# Patient Record
Sex: Female | Born: 1965 | Race: White | Hispanic: No | Marital: Single | Smoking: Current every day smoker
Health system: Southern US, Community
[De-identification: ages and names within clinical notes are randomized; demographics above are authoritative.]

## PROBLEM LIST (undated history)

## (undated) ENCOUNTER — Emergency Department (HOSPITAL_COMMUNITY): Admission: EM | Payer: Medicare Other | Source: Home / Self Care

## (undated) DIAGNOSIS — F410 Panic disorder [episodic paroxysmal anxiety] without agoraphobia: Secondary | ICD-10-CM

## (undated) DIAGNOSIS — F329 Major depressive disorder, single episode, unspecified: Secondary | ICD-10-CM

## (undated) DIAGNOSIS — F1020 Alcohol dependence, uncomplicated: Secondary | ICD-10-CM

## (undated) DIAGNOSIS — T1491XA Suicide attempt, initial encounter: Secondary | ICD-10-CM

## (undated) DIAGNOSIS — F319 Bipolar disorder, unspecified: Secondary | ICD-10-CM

## (undated) DIAGNOSIS — E7800 Pure hypercholesterolemia, unspecified: Secondary | ICD-10-CM

## (undated) DIAGNOSIS — F419 Anxiety disorder, unspecified: Secondary | ICD-10-CM

## (undated) DIAGNOSIS — R002 Palpitations: Secondary | ICD-10-CM

## (undated) DIAGNOSIS — F1120 Opioid dependence, uncomplicated: Secondary | ICD-10-CM

## (undated) DIAGNOSIS — B86 Scabies: Secondary | ICD-10-CM

## (undated) DIAGNOSIS — T63301A Toxic effect of unspecified spider venom, accidental (unintentional), initial encounter: Secondary | ICD-10-CM

## (undated) DIAGNOSIS — J40 Bronchitis, not specified as acute or chronic: Secondary | ICD-10-CM

## (undated) DIAGNOSIS — I10 Essential (primary) hypertension: Secondary | ICD-10-CM

## (undated) DIAGNOSIS — J449 Chronic obstructive pulmonary disease, unspecified: Secondary | ICD-10-CM

## (undated) DIAGNOSIS — F32A Depression, unspecified: Secondary | ICD-10-CM

## (undated) DIAGNOSIS — Z87442 Personal history of urinary calculi: Secondary | ICD-10-CM

## (undated) HISTORY — DX: Bronchitis, not specified as acute or chronic: J40

## (undated) HISTORY — PX: CERVICAL CERCLAGE: SHX1329

## (undated) HISTORY — DX: Opioid dependence, uncomplicated: F11.20

## (undated) HISTORY — DX: Toxic effect of unspecified spider venom, accidental (unintentional), initial encounter: T63.301A

## (undated) HISTORY — DX: Alcohol dependence, uncomplicated: F10.20

## (undated) HISTORY — DX: Bipolar disorder, unspecified: F31.9

## (undated) HISTORY — DX: Suicide attempt, initial encounter: T14.91XA

---

## 1998-05-10 DIAGNOSIS — F319 Bipolar disorder, unspecified: Secondary | ICD-10-CM

## 1998-05-10 HISTORY — DX: Bipolar disorder, unspecified: F31.9

## 2006-05-10 DIAGNOSIS — T63301A Toxic effect of unspecified spider venom, accidental (unintentional), initial encounter: Secondary | ICD-10-CM

## 2006-05-10 HISTORY — DX: Toxic effect of unspecified spider venom, accidental (unintentional), initial encounter: T63.301A

## 2006-10-26 ENCOUNTER — Emergency Department (HOSPITAL_COMMUNITY): Admission: EM | Admit: 2006-10-26 | Discharge: 2006-10-27 | Payer: Self-pay | Admitting: Emergency Medicine

## 2007-06-21 ENCOUNTER — Emergency Department (HOSPITAL_COMMUNITY): Admission: EM | Admit: 2007-06-21 | Discharge: 2007-06-21 | Payer: Self-pay | Admitting: Emergency Medicine

## 2008-03-16 ENCOUNTER — Emergency Department (HOSPITAL_COMMUNITY): Admission: EM | Admit: 2008-03-16 | Discharge: 2008-03-16 | Payer: Self-pay | Admitting: Emergency Medicine

## 2008-05-10 HISTORY — PX: OTHER SURGICAL HISTORY: SHX169

## 2008-08-01 ENCOUNTER — Emergency Department (HOSPITAL_COMMUNITY): Admission: EM | Admit: 2008-08-01 | Discharge: 2008-08-02 | Payer: Self-pay | Admitting: Emergency Medicine

## 2009-02-18 ENCOUNTER — Ambulatory Visit: Payer: Self-pay | Admitting: Family Medicine

## 2009-02-18 DIAGNOSIS — R5381 Other malaise: Secondary | ICD-10-CM | POA: Insufficient documentation

## 2009-02-18 DIAGNOSIS — F17200 Nicotine dependence, unspecified, uncomplicated: Secondary | ICD-10-CM | POA: Insufficient documentation

## 2009-02-18 DIAGNOSIS — F329 Major depressive disorder, single episode, unspecified: Secondary | ICD-10-CM | POA: Insufficient documentation

## 2009-02-18 DIAGNOSIS — N920 Excessive and frequent menstruation with regular cycle: Secondary | ICD-10-CM | POA: Insufficient documentation

## 2009-02-18 DIAGNOSIS — J0100 Acute maxillary sinusitis, unspecified: Secondary | ICD-10-CM | POA: Insufficient documentation

## 2009-02-18 DIAGNOSIS — F319 Bipolar disorder, unspecified: Secondary | ICD-10-CM | POA: Insufficient documentation

## 2009-02-18 DIAGNOSIS — R5383 Other fatigue: Secondary | ICD-10-CM

## 2009-02-18 LAB — CONVERTED CEMR LAB
ALT: 10 units/L (ref 0–35)
AST: 13 units/L (ref 0–37)
Albumin: 4.5 g/dL (ref 3.5–5.2)
Alkaline Phosphatase: 52 units/L (ref 39–117)
BUN: 10 mg/dL (ref 6–23)
Basophils Absolute: 0 10*3/uL (ref 0.0–0.1)
Basophils Relative: 0 % (ref 0–1)
Bilirubin, Direct: 0.1 mg/dL (ref 0.0–0.3)
CO2: 20 meq/L (ref 19–32)
Calcium: 8.7 mg/dL (ref 8.4–10.5)
Chloride: 105 meq/L (ref 96–112)
Cholesterol: 156 mg/dL (ref 0–200)
Creatinine, Ser: 0.72 mg/dL (ref 0.40–1.20)
Eosinophils Absolute: 0.2 10*3/uL (ref 0.0–0.7)
Eosinophils Relative: 2 % (ref 0–5)
Glucose, Bld: 90 mg/dL (ref 70–99)
HCT: 37.3 % (ref 36.0–46.0)
HDL: 43 mg/dL (ref >39)
Hemoglobin: 12.4 g/dL (ref 12.0–15.0)
Indirect Bilirubin: 0.2 mg/dL (ref 0.0–0.9)
LDL Cholesterol: 92 mg/dL (ref 0–99)
Lymphocytes Relative: 26 % (ref 12–46)
Lymphs Abs: 2.7 10*3/uL (ref 0.7–4.0)
MCHC: 33.2 g/dL (ref 30.0–36.0)
MCV: 96.1 fL (ref 78.0–100.0)
Monocytes Absolute: 0.6 10*3/uL (ref 0.1–1.0)
Monocytes Relative: 5 % (ref 3–12)
Neutro Abs: 6.9 10*3/uL (ref 1.7–7.7)
Neutrophils Relative %: 67 % (ref 43–77)
Platelets: 222 10*3/uL (ref 150–400)
Potassium: 3.9 meq/L (ref 3.5–5.3)
RBC: 3.88 M/uL (ref 3.87–5.11)
RDW: 14.6 % (ref 11.5–15.5)
Sodium: 139 meq/L (ref 135–145)
TSH: 0.565 microintl units/mL (ref 0.350–4.500)
Total Bilirubin: 0.3 mg/dL (ref 0.3–1.2)
Total CHOL/HDL Ratio: 3.6
Total Protein: 6.8 g/dL (ref 6.0–8.3)
Triglycerides: 104 mg/dL (ref <150)
VLDL: 21 mg/dL (ref 0–40)
WBC: 10.4 10*3/uL (ref 4.0–10.5)

## 2009-02-28 ENCOUNTER — Ambulatory Visit (HOSPITAL_COMMUNITY): Admission: RE | Admit: 2009-02-28 | Discharge: 2009-02-28 | Payer: Self-pay | Admitting: Family Medicine

## 2009-04-01 ENCOUNTER — Ambulatory Visit: Payer: Self-pay | Admitting: Family Medicine

## 2009-04-01 DIAGNOSIS — N3000 Acute cystitis without hematuria: Secondary | ICD-10-CM | POA: Insufficient documentation

## 2009-04-01 DIAGNOSIS — N739 Female pelvic inflammatory disease, unspecified: Secondary | ICD-10-CM | POA: Insufficient documentation

## 2009-04-01 LAB — CONVERTED CEMR LAB
Basophils Absolute: 0.1 10*3/uL (ref 0.0–0.1)
Basophils Relative: 0 % (ref 0–1)
Bilirubin Urine: NEGATIVE
Blood in Urine, dipstick: NEGATIVE
Eosinophils Absolute: 0.2 10*3/uL (ref 0.0–0.7)
Eosinophils Relative: 2 % (ref 0–5)
Glucose, Urine, Semiquant: NEGATIVE
HCT: 37.9 % (ref 36.0–46.0)
Hemoglobin: 12.7 g/dL (ref 12.0–15.0)
Ketones, urine, test strip: NEGATIVE
Lymphocytes Relative: 27 % (ref 12–46)
Lymphs Abs: 3.3 10*3/uL (ref 0.7–4.0)
MCHC: 33.5 g/dL (ref 30.0–36.0)
MCV: 93.8 fL (ref 78.0–100.0)
Monocytes Absolute: 0.7 10*3/uL (ref 0.1–1.0)
Monocytes Relative: 6 % (ref 3–12)
Neutro Abs: 7.9 10*3/uL — ABNORMAL HIGH (ref 1.7–7.7)
Neutrophils Relative %: 65 % (ref 43–77)
Nitrite: NEGATIVE
Platelets: 262 10*3/uL (ref 150–400)
Protein, U semiquant: NEGATIVE
RBC: 4.04 M/uL (ref 3.87–5.11)
RDW: 14.3 % (ref 11.5–15.5)
Specific Gravity, Urine: 1.01
Urobilinogen, UA: 0.2
WBC Urine, dipstick: NEGATIVE
WBC: 12.1 10*3/uL — ABNORMAL HIGH (ref 4.0–10.5)
pH: 7

## 2009-04-09 ENCOUNTER — Encounter: Payer: Self-pay | Admitting: Family Medicine

## 2009-05-05 ENCOUNTER — Emergency Department (HOSPITAL_COMMUNITY): Admission: EM | Admit: 2009-05-05 | Discharge: 2009-05-05 | Payer: Self-pay | Admitting: Emergency Medicine

## 2009-05-07 ENCOUNTER — Ambulatory Visit: Payer: Self-pay | Admitting: Family Medicine

## 2009-05-07 ENCOUNTER — Other Ambulatory Visit: Admission: RE | Admit: 2009-05-07 | Discharge: 2009-05-07 | Payer: Self-pay | Admitting: Family Medicine

## 2009-05-07 DIAGNOSIS — R21 Rash and other nonspecific skin eruption: Secondary | ICD-10-CM | POA: Insufficient documentation

## 2009-05-07 DIAGNOSIS — L851 Acquired keratosis [keratoderma] palmaris et plantaris: Secondary | ICD-10-CM | POA: Insufficient documentation

## 2009-05-08 ENCOUNTER — Encounter: Payer: Self-pay | Admitting: Family Medicine

## 2009-05-08 LAB — CONVERTED CEMR LAB
Chlamydia, DNA Probe: NEGATIVE
GC Probe Amp, Genital: NEGATIVE

## 2009-05-09 ENCOUNTER — Encounter: Payer: Self-pay | Admitting: Family Medicine

## 2009-05-11 LAB — CONVERTED CEMR LAB
Candida species: POSITIVE — AB
Gardnerella vaginalis: NEGATIVE

## 2009-05-23 ENCOUNTER — Telehealth: Payer: Self-pay | Admitting: Family Medicine

## 2009-05-29 ENCOUNTER — Encounter: Payer: Self-pay | Admitting: Family Medicine

## 2009-07-11 ENCOUNTER — Ambulatory Visit: Payer: Self-pay | Admitting: Family Medicine

## 2009-07-11 DIAGNOSIS — H60399 Other infective otitis externa, unspecified ear: Secondary | ICD-10-CM | POA: Insufficient documentation

## 2009-08-21 ENCOUNTER — Emergency Department (HOSPITAL_COMMUNITY): Admission: EM | Admit: 2009-08-21 | Discharge: 2009-08-21 | Payer: Self-pay | Admitting: Emergency Medicine

## 2010-02-02 ENCOUNTER — Emergency Department (HOSPITAL_COMMUNITY): Admission: EM | Admit: 2010-02-02 | Discharge: 2010-02-02 | Payer: Self-pay | Admitting: Emergency Medicine

## 2010-02-18 ENCOUNTER — Telehealth: Payer: Self-pay | Admitting: Family Medicine

## 2010-02-21 ENCOUNTER — Emergency Department (HOSPITAL_COMMUNITY): Admission: EM | Admit: 2010-02-21 | Discharge: 2010-02-21 | Payer: Self-pay | Admitting: Emergency Medicine

## 2010-02-26 ENCOUNTER — Ambulatory Visit: Payer: Self-pay | Admitting: Family Medicine

## 2010-02-26 ENCOUNTER — Telehealth: Payer: Self-pay | Admitting: Family Medicine

## 2010-02-26 DIAGNOSIS — M79609 Pain in unspecified limb: Secondary | ICD-10-CM | POA: Insufficient documentation

## 2010-02-26 DIAGNOSIS — N760 Acute vaginitis: Secondary | ICD-10-CM | POA: Insufficient documentation

## 2010-02-26 DIAGNOSIS — N764 Abscess of vulva: Secondary | ICD-10-CM | POA: Insufficient documentation

## 2010-02-27 ENCOUNTER — Encounter: Payer: Self-pay | Admitting: Family Medicine

## 2010-02-27 LAB — CONVERTED CEMR LAB
Chlamydia, DNA Probe: NEGATIVE
GC Probe Amp, Genital: NEGATIVE

## 2010-02-28 ENCOUNTER — Encounter: Payer: Self-pay | Admitting: Family Medicine

## 2010-02-28 LAB — CONVERTED CEMR LAB
Candida species: NEGATIVE
Gardnerella vaginalis: NEGATIVE

## 2010-05-17 ENCOUNTER — Emergency Department (HOSPITAL_COMMUNITY)
Admission: EM | Admit: 2010-05-17 | Discharge: 2010-05-18 | Payer: Self-pay | Source: Home / Self Care | Admitting: Emergency Medicine

## 2010-05-20 ENCOUNTER — Ambulatory Visit: Admit: 2010-05-20 | Payer: Self-pay | Admitting: Family Medicine

## 2010-05-22 ENCOUNTER — Ambulatory Visit
Admission: RE | Admit: 2010-05-22 | Discharge: 2010-05-22 | Payer: Self-pay | Source: Home / Self Care | Attending: Family Medicine | Admitting: Family Medicine

## 2010-05-22 ENCOUNTER — Encounter: Payer: Self-pay | Admitting: Family Medicine

## 2010-05-22 DIAGNOSIS — M899 Disorder of bone, unspecified: Secondary | ICD-10-CM | POA: Insufficient documentation

## 2010-05-22 DIAGNOSIS — N912 Amenorrhea, unspecified: Secondary | ICD-10-CM | POA: Insufficient documentation

## 2010-05-22 DIAGNOSIS — N3090 Cystitis, unspecified without hematuria: Secondary | ICD-10-CM | POA: Insufficient documentation

## 2010-05-22 DIAGNOSIS — M949 Disorder of cartilage, unspecified: Secondary | ICD-10-CM

## 2010-05-22 DIAGNOSIS — L259 Unspecified contact dermatitis, unspecified cause: Secondary | ICD-10-CM | POA: Insufficient documentation

## 2010-05-22 LAB — CONVERTED CEMR LAB
Beta hcg, urine, semiquantitative: NEGATIVE
Blood in Urine, dipstick: NEGATIVE
Glucose, Urine, Semiquant: NEGATIVE
Nitrite: NEGATIVE
Protein, U semiquant: 30
Specific Gravity, Urine: 1.02
Urobilinogen, UA: 0.2
pH: 6

## 2010-05-23 ENCOUNTER — Encounter: Payer: Self-pay | Admitting: Family Medicine

## 2010-05-24 DIAGNOSIS — B369 Superficial mycosis, unspecified: Secondary | ICD-10-CM | POA: Insufficient documentation

## 2010-05-24 LAB — CONVERTED CEMR LAB
BUN: 9 mg/dL (ref 6–23)
Basophils Absolute: 0 10*3/uL (ref 0.0–0.1)
Basophils Relative: 1 % (ref 0–1)
CO2: 25 meq/L (ref 19–32)
Calcium: 9.7 mg/dL (ref 8.4–10.5)
Chloride: 101 meq/L (ref 96–112)
Cholesterol: 205 mg/dL — ABNORMAL HIGH (ref 0–200)
Creatinine, Ser: 0.68 mg/dL (ref 0.40–1.20)
Eosinophils Absolute: 0.2 10*3/uL (ref 0.0–0.7)
Eosinophils Relative: 2 % (ref 0–5)
Glucose, Bld: 97 mg/dL (ref 70–99)
HCT: 40.9 % (ref 36.0–46.0)
HDL: 56 mg/dL (ref >39)
Hemoglobin: 13.3 g/dL (ref 12.0–15.0)
LDL Cholesterol: 122 mg/dL — ABNORMAL HIGH (ref 0–99)
Lymphocytes Relative: 25 % (ref 12–46)
Lymphs Abs: 2.1 10*3/uL (ref 0.7–4.0)
MCHC: 32.5 g/dL (ref 30.0–36.0)
MCV: 97.4 fL (ref 78.0–100.0)
Monocytes Absolute: 0.7 10*3/uL (ref 0.1–1.0)
Monocytes Relative: 9 % (ref 3–12)
Neutro Abs: 5.5 10*3/uL (ref 1.7–7.7)
Neutrophils Relative %: 64 % (ref 43–77)
Platelets: 249 10*3/uL (ref 150–400)
Potassium: 4.1 meq/L (ref 3.5–5.3)
RBC: 4.2 M/uL (ref 3.87–5.11)
RDW: 15.2 % (ref 11.5–15.5)
Sodium: 137 meq/L (ref 135–145)
TSH: 1.171 microintl units/mL (ref 0.350–4.500)
Total CHOL/HDL Ratio: 3.7
Triglycerides: 137 mg/dL (ref <150)
VLDL: 27 mg/dL (ref 0–40)
Vit D, 25-Hydroxy: 38 ng/mL (ref 30–89)
WBC: 8.5 10*3/uL (ref 4.0–10.5)

## 2010-05-25 LAB — WET PREP, GENITAL
Trich, Wet Prep: NONE SEEN
Yeast Wet Prep HPF POC: NONE SEEN

## 2010-05-25 LAB — GC/CHLAMYDIA PROBE AMP, GENITAL
Chlamydia, DNA Probe: NEGATIVE
GC Probe Amp, Genital: NEGATIVE

## 2010-05-25 LAB — PREGNANCY, URINE: Preg Test, Ur: NEGATIVE

## 2010-05-28 ENCOUNTER — Emergency Department (HOSPITAL_COMMUNITY)
Admission: EM | Admit: 2010-05-28 | Discharge: 2010-05-28 | Payer: Self-pay | Source: Home / Self Care | Admitting: Emergency Medicine

## 2010-05-31 ENCOUNTER — Encounter: Payer: Self-pay | Admitting: Family Medicine

## 2010-06-10 NOTE — Progress Notes (Signed)
Summary: medicines  Phone Note Call from Patient   Summary of Call: needs her medicine send to [facility] Initial call taken by: Lind Guest,  February 26, 2010 2:56 PM    Prescriptions: DOXYCYCLINE HYCLATE 100 MG CAPS (DOXYCYCLINE HYCLATE) Take 1 capsule by mouth two times a day  #14 x 0   Entered by:   Adella Hare LPN   Authorized by:   Syliva Overman MD   Signed by:   Adella Hare LPN on 95/62/1308   Method used:   Electronically to        [facility]* (retail)       [address] [place], [place]  [address]       Ph: [phone]       Fax: [phone]   RxID:   [id number]

## 2010-06-10 NOTE — Progress Notes (Signed)
Summary: DERMATOLOGY  DERMATOLOGY   Imported By: Lind Guest 06/18/2009 13:32:55  _____________________________________________________________________  External Attachment:    Type:   Image     Comment:   External Document

## 2010-06-10 NOTE — Assessment & Plan Note (Signed)
Summary: follow on ear, refill on scabies med - room 2   Vital Signs:  Patient profile:   45 year old female Menstrual status:  irregular Height:      60 inches Weight:      106.25 pounds BMI:     20.83 O2 Sat:      98 % on Room air Pulse rate:   93 / minute Resp:     16 per minute BP sitting:   120 / 80  (left arm)  Vitals Entered By: Adella Hare LPN (July 11, 2701 10:12 AM) CC: follow up ear ache, wants refill on scabies med Is Patient Diabetic? No Pain Assessment Patient in pain? no        Primary Provider:  Syliva Overman MD  CC:  follow up ear ache and wants refill on scabies med.  History of Present Illness: Pt is here today with c/o Rt ear discomfort & drainage & crusting since approx Christmas time.  Pt is somewhat of a poor historian.  Apparently she has been seen at the ER for this, but is uncertain what they prescribed for her.  She denies any fever.  No nasal congestion.  Pt also has had a persistent rash on her back.  Sounds like she has had this for months.  She brings an old tube of Elimite & states she has used this med 3 x & it seems to help some with the itching but the rash never goes away.  Then the itching returns.  She did see Dr Margo Aye in Jan 2011.  He prescribed oral antibiotics for her.  The rash is not spreading.  She did wash her bedding, etc after treatment with the Elimite.  Current Medications (verified): 1)  Clonazepam 0.5 Mg Tbdp (Clonazepam) .... One Half To One Tab By Mouth As Needed For Anxiety 2)  Trazodone Hcl 50 Mg Tabs (Trazodone Hcl) .... One To Two Tabs By Mouth Qhs 3)  Sertraline Hcl 100 Mg Tabs (Sertraline Hcl) .... One Tab By Mouth Qd 4)  Fluconazole 150 Mg Tabs (Fluconazole) .... Take 1 Tablet By Mouth Once A Day As Needed 5)  Doxycycline Hyclate 100 Mg Caps (Doxycycline Hyclate) .... Take 1 Capsule By Mouth Two Times A Day 6)  Fluconazole 150 Mg Tabs (Fluconazole) .... Take One Tablet Today, and Another Tablet Next Week  Wednesday 7)  Terazol 7 0.4 % Crea (Terconazole) .Marland Kitchen.. 1 Applicator Intravaginally  Allergies (verified): 1)  ! * Pcn  Past History:  Past medical history reviewed for relevance to current acute and chronic problems.  Past Medical History: Reviewed history from 02/18/2009 and no changes required. Bipolar disease  2000, Dr Betti Cruz, remote  h/o suicide attempt Seen uin the ED with a spider bite in the Summer 2008     Review of Systems General:  Denies chills and fever. ENT:  Complains of ear discharge; denies nasal congestion, postnasal drainage, and sore throat. CV:  Denies chest pain or discomfort. Resp:  Denies cough. Derm:  Complains of itching and rash. Heme:  Denies enlarge lymph nodes and fevers.  Physical Exam  General:  alert and well-developed.   Head:  Normocephalic and atraumatic without obvious abnormalities. No apparent alopecia or balding. Ears:  L ear normal, R canal inflamed, and R Canal drainage.   Nose:  External nasal examination shows no deformity or inflammation. Nasal mucosa are pink and moist without lesions or exudates. Mouth:  pharynx pink and moist, no erythema, no exudates, and teeth  missing.   Neck:  No deformities, masses, or tenderness noted. Lungs:  Normal respiratory effort, chest expands symmetrically. Lungs are clear to auscultation, no crackles or wheezes. Heart:  Normal rate and regular rhythm. S1 and S2 normal without gallop, murmur, click, rub or other extra sounds. Skin:  areas of excoriation & excoriated papules noted mid lumbar area.  remainder of skin is clear. Cervical Nodes:  No lymphadenopathy noted Psych:  normally interactive and good eye contact.     Impression & Recommendations:  Problem # 1:  OTITIS EXTERNA (ICD-380.10) Assessment New  Her updated medication list for this problem includes:    Cipro Hc 0.2-1 % Susp (Ciprofloxacin-hydrocortisone) .Marland Kitchen... 2 gtts three times a day rt ear x 7 days  Problem # 2:  RASH AND OTHER  NONSPECIFIC SKIN ERUPTION (ICD-782.1) Assessment: Unchanged Chronic.  Discussed with pt that I don't think that this is scabies.  It has been going on x months, & not spreading.   Recommend she follow up with Dr. Margo Aye. Her updated medication list for this problem includes:    Triamcinolone Acetonide 0.1 % Crea (Triamcinolone acetonide) .Marland Kitchen... Apply two times a day to rash prn  Problem # 3:  NICOTINE ADDICTION (ICD-305.1) Assessment: Unchanged  Encouraged smoking cessation  Complete Medication List: 1)  Clonazepam 0.5 Mg Tbdp (Clonazepam) .... One half to one tab by mouth as needed for anxiety 2)  Trazodone Hcl 50 Mg Tabs (Trazodone hcl) .... One to two tabs by mouth qhs 3)  Sertraline Hcl 100 Mg Tabs (Sertraline hcl) .... One tab by mouth qd 4)  Fluconazole 150 Mg Tabs (Fluconazole) .... Take 1 tablet by mouth once a day as needed 5)  Doxycycline Hyclate 100 Mg Caps (Doxycycline hyclate) .... Take 1 capsule by mouth two times a day 6)  Fluconazole 150 Mg Tabs (Fluconazole) .... Take one tablet today, and another tablet next week wednesday 7)  Terazol 7 0.4 % Crea (Terconazole) .Marland Kitchen.. 1 applicator intravaginally 8)  Cipro Hc 0.2-1 % Susp (Ciprofloxacin-hydrocortisone) .... 2 gtts three times a day rt ear x 7 days 9)  Triamcinolone Acetonide 0.1 % Crea (Triamcinolone acetonide) .... Apply two times a day to rash prn  Patient Instructions: 1)  Please schedule a follow-up appointment as needed if your ear doesnt improve. 2)  Schedule an appt with Dr Margo Aye (dermatologist). 3)  Tobacco is very bad for your health and your loved ones! You Should stop smoking!. Prescriptions: TRIAMCINOLONE ACETONIDE 0.1 % CREA (TRIAMCINOLONE ACETONIDE) apply two times a day to rash prn  #30 grams x 0   Entered and Authorized by:   Esperanza Sheets PA   Signed by:   Esperanza Sheets PA on 07/11/2009   Method used:   Electronically to        [facility] (retail)       [address]       [place], [place]  [address]       Ph: [phone]       Fax: [phone]   RxID:   [id number] CIPRO HC 0.2-1 % SUSP (CIPROFLOXACIN-HYDROCORTISONE) 2 gtts three times a day Rt ear x 7 days  #1 bottle x 0   Entered and Authorized by:   Esperanza Sheets PA   Signed by:   Esperanza Sheets PA on 07/11/2009   Method used:   Electronically to        [facility] (retail)  [address]       [place], [place]  [address]       Ph: [phone]       Fax: [phone]   RxID:   [id number]

## 2010-06-10 NOTE — Progress Notes (Signed)
Summary: NEEDS MORE CREAM  Phone Note Call from Patient   Summary of Call: SHE IS ITCHING  THE CREAM DONE IT SOME GOOD COULD YOU SEND SOME IN SHE IS OUT [employer] [facility]  CALL WHEN DONE [phone] Initial call taken by: Lind Guest,  May 23, 2009 9:49 AM  Follow-up for Phone Call        pls erx terazole cream, x 1 refill, see med list Follow-up by: Syliva Overman MD,  May 23, 2009 1:10 PM  Additional Follow-up for Phone Call Additional follow up Details #1::        Patient aware. cream sent Additional Follow-up by: Everitt Amber,  May 23, 2009 3:00 PM    New/Updated Medications: TERAZOL 7 0.4 % CREA (TERCONAZOLE) 1 applicator intravaginally Prescriptions: TERAZOL 7 0.4 % CREA (TERCONAZOLE) 1 applicator intravaginally  #45gm x 0   Entered by:   Everitt Amber   Authorized by:   Syliva Overman MD   Signed by:   Everitt Amber on 05/23/2009   Method used:   Electronically to        [facility] [phone]* (retail)       [address]       [place], [place]  [address]       Ph: [phone] or [phone]       Fax: [phone]   RxID:   [id number]

## 2010-06-10 NOTE — Assessment & Plan Note (Signed)
Summary: PHY/ ????/ RASH   Vital Signs:  Patient profile:   45 year old female Menstrual status:  irregular Height:      60 inches Weight:      104.25 pounds BMI:     20.43 O2 Sat:      100 % on Room air Pulse rate:   90 / minute Resp:     16 per minute BP sitting:   98 / 70  (left arm)  Vitals Entered By: Mauricia Area CMA (February 26, 2010 1:21 PM) CC: Physical. Right quad pain, pain radiates to lower leg. Vaginal itch and whole body itch   Primary Provider:  Syliva Overman MD  CC:  Physical. Right quad pain and pain radiates to lower leg. Vaginal itch and whole body itch.  History of Present Illness: Pt scheduled for a physical/pap today but she is not due until Dec 2011.  Pt was seen at [facility] ER 02-21-10 for a bump on her genitalia and pain in her Rt thigh.  Was prescribed Bactroban and Ibuprofen 600mg  but she as not filled the rxs because she has not had the money.  States she now also has vaginal dischg in color, white, and very itchy.  She is sexually active, no new partners, no condom use.  The bump on her genitalia is still very tender.   She states she has been doing a lot of yard work, and has flared up her back pain, and is having pain in her Rt thigh.  The pain in her thigh is worse when she has been out in the yard raking leaves, etc. Improves if she rests for a couple of days.  No trauma.  No parasthesias.  Pt is hoping that we have samples or medication in the office that she can have since she is  unable to afford her rxs.     Current Medications (verified): 1)  Clonazepam 0.5 Mg Tbdp (Clonazepam) .... One Half To One Tab By Mouth As Needed For Anxiety 2)  Trazodone Hcl 50 Mg Tabs (Trazodone Hcl) .... One To Two Tabs By Mouth At Bedtime. 3)  Sertraline Hcl 100 Mg Tabs (Sertraline Hcl) .... One Tab By Mouth Once Daily. 4)  Fluconazole 150 Mg Tabs (Fluconazole) .... Take 1 Tablet By Mouth Once A Day As Needed 5)  Doxycycline Hyclate 100 Mg Caps (Doxycycline  Hyclate) .... Take 1 Capsule By Mouth Two Times A Day 6)  Fluconazole 150 Mg Tabs (Fluconazole) .... Take One Tablet Today, and Another Tablet Next Week Wednesday 7)  Terazol 7 0.4 % Crea (Terconazole) .Marland Kitchen.. 1 Applicator Intravaginally 8)  Triamcinolone Acetonide 0.1 % Crea (Triamcinolone Acetonide) .... Apply Two Times A Day To Rash Prn  Allergies (verified): 1)  ! * Pcn  Past History:  Past medical history reviewed for relevance to current acute and chronic problems.  Past Medical History: Reviewed history from 02/18/2009 and no changes required. Bipolar disease  2000, Dr Betti Cruz, remote  h/o suicide attempt Seen uin the ED with a spider bite in the Summer 2008     Review of Systems General:  Denies chills and fever. GU:  Complains of discharge and genital sores; denies dysuria. MS:  Complains of low back pain and muscle aches.  Physical Exam  General:  Well-developed,well-nourished,in no acute distress; alert,appropriate and cooperative throughout examination Head:  Normocephalic and atraumatic without obvious abnormalities. No apparent alopecia or balding. Lungs:  Normal respiratory effort, chest expands symmetrically. Lungs are clear to auscultation, no crackles or  wheezes. Heart:  Normal rate and regular rhythm. S1 and S2 normal without gallop, murmur, click, rub or other extra sounds. Genitalia:  Rt vulva, lateral Rt labia minora approx 1 cm tender nodule palpable with mild erythema, no pustular head. Vag mod amt of white dischg. normal uterus size and position and no adnexal masses or tenderness.   Msk:  LS spine:  muscular TTP Lt lumbar, FROM. Rt hip nontender.  soft tissue TTP Rt anterior mid thigh, without palp mass or swelling.  Extremities:  No clubbing, cyanosis, edema, or deformity noted with normal full range of motion of all joints.   Neurologic:  alert & oriented X3, sensation intact to light touch, gait normal, and DTRs symmetrical and normal.   Psych:  memory  intact for recent and remote and moderately anxious.     Impression & Recommendations:  Problem # 1:  ABSCESS, VULVA (ICD-616.4) Assessment New  Problem # 2:  VAGINITIS (ICD-616.10) Assessment: New  The following medications were removed from the medication list:    Terazol 7 0.4 % Crea (Terconazole) .Marland Kitchen... 1 applicator intravaginally Her updated medication list for this problem includes:    Doxycycline Hyclate 100 Mg Caps (Doxycycline hyclate) .Marland Kitchen... Take 1 capsule by mouth two times a day  Orders: T-Wet Prep by Molecular Probe [phone]) T-Chlamydia & GC Probe, Genital (87491/[id number])  Problem # 3:  LEG PAIN (ICD-729.5) Assessment: New  Orders: Depo- Medrol 80mg  (J1040) Ketorolac-Toradol 15mg  (W2956) Admin of Therapeutic Inj  intramuscular or subcutaneous (21308)  Complete Medication List: 1)  Clonazepam 0.5 Mg Tbdp (Clonazepam) .... One half to one tab by mouth as needed for anxiety 2)  Trazodone Hcl 50 Mg Tabs (Trazodone hcl) .... One to two tabs by mouth at bedtime. 3)  Sertraline Hcl 100 Mg Tabs (Sertraline hcl) .... One tab by mouth once daily. 4)  Doxycycline Hyclate 100 Mg Caps (Doxycycline hyclate) .... Take 1 capsule by mouth two times a day  Patient Instructions: 1)  Schedule a physical/pap appt on or after 05-07-10. 2)  You have a skin infection of your genitalia.  I have prescribed an antibiotic for this. Take your antibiotic twice a day as prescribed. 3)  You may also continue soaking in a warm tub as needed for comfort. 4)  You will be notified if your vaginal cultures are abnormal. Prescriptions: DOXYCYCLINE HYCLATE 100 MG CAPS (DOXYCYCLINE HYCLATE) Take 1 capsule by mouth two times a day  #14 x 0   Entered and Authorized by:   Esperanza Sheets PA   Signed by:   Esperanza Sheets PA on 02/26/2010   Method used:   Electronically to        [facility] [phone]* (retail)       [address]       [place], [place]  [address]       Ph:  [phone] or [phone]       Fax: [phone]   RxID:   [id number]    Medication Administration  Injection # 1:    Medication: Depo- Medrol 80mg     Diagnosis: LEG PAIN (ICD-729.5)    Route: IM    Site: LUOQ gluteus    Exp Date: 10/2010    Lot #: [id number]    Mfr: Pharmacia    Comments: 80 mg given    Patient tolerated injection without complications    Given by: Mauricia Area CMA (February 26, 2010 3:09 PM)  Injection # 2:  Medication: Ketorolac-Toradol 15mg     Diagnosis: LEG PAIN (ICD-729.5)    Route: IM    Site: RUOQ gluteus    Exp Date: 03/11/2011    Lot #: [id number]    Mfr: novaplus    Comments: 60 mg given    Patient tolerated injection without complications    Given by: Mauricia Area CMA (February 26, 2010 3:10 PM)  Orders Added: 1)  Depo- Medrol 80mg  [J1040] 2)  Ketorolac-Toradol 15mg  [J1885] 3)  Admin of Therapeutic Inj  intramuscular or subcutaneous [96[address]] 4)  T-Wet Prep by Molecular Probe [[id number]] 5)  T-Chlamydia & GC Probe, Genital [87491/[id number]] 6)  Est. Patient Level IV [09811]

## 2010-06-10 NOTE — Assessment & Plan Note (Signed)
Summary: office visit   Vital Signs:  Patient profile:   45 year old female Menstrual status:  irregular Height:      60 inches Weight:      107 pounds BMI:     20.97 O2 Sat:      98 % Pulse rate:   91 / minute Pulse rhythm:   regular Resp:     16 per minute BP sitting:   118 / 80 Cuff size:   regular  Vitals Entered ByMarland Kitchen Everitt Amber (May 07, 2009 4:02 PM) CC: Follow up, has a burning in her back with a rash. Thought it was scabies. It is on her belly and elbow now. Was told that maybe it was from the antibiotic. Also has a yeast infection  Vision Screening:Left eye with correction: 20 / 30 Right eye with correction: 20 / 30 Both eyes with correction: 20 / 30  Color vision testing: normal      Vision Entered By: Everitt Amber (May 07, 2009 4:41 PM)   Primary Care Provider:  Syliva Overman MD  CC:  Follow up and has a burning in her back with a rash. Thought it was scabies. It is on her belly and elbow now. Was told that maybe it was from the antibiotic. Also has a yeast infection.  History of Present Illness: Pt states she was seen in theED because of a rash which developed after starting the last antibiotics I prescribed. Seh was advised that it may have been an allergic rxn, the med was changed, and she wa salso prescribed medication for scabies, which she has not used yet. She is questioning the diagnoses and wants to see dermatology about this. Sehc/o puritic vaginal d/c also. She denies current fever or chills. She denies uncontrolled depresson or mental health problems. he has a driver's license form to be completed , which her psychiatrist has already filled in partially.  Current Medications (verified): 1)  Clonazepam 0.5 Mg Tbdp (Clonazepam) .... One Half To One Tab By Mouth As Needed For Anxiety 2)  Trazodone Hcl 50 Mg Tabs (Trazodone Hcl) .... One To Two Tabs By Mouth Qhs 3)  Sertraline Hcl 100 Mg Tabs (Sertraline Hcl) .... One Tab By Mouth Qd 4)   Fluconazole 150 Mg Tabs (Fluconazole) .... Take 1 Tablet By Mouth Once A Day As Needed 5)  Doxycycline Hyclate 100 Mg Caps (Doxycycline Hyclate) .... Take 1 Capsule By Mouth Two Times A Day 6)  Fluconazole 150 Mg Tabs (Fluconazole) .... Take One Tablet Today, and Another Tablet Next Week Wednesday  Allergies (verified): 1)  ! * Pcn  Review of Systems      See HPI General:  Denies chills, fatigue, and fever. Eyes:  Denies blurring, discharge, eye pain, and red eye. ENT:  Denies hoarseness, nasal congestion, sinus pressure, and sore throat. CV:  Denies chest pain or discomfort, difficulty breathing while lying down, palpitations, and swelling of feet. Resp:  Denies cough, sputum productive, and wheezing. GI:  Denies abdominal pain, constipation, diarrhea, nausea, and vomiting. GU:  Complains of discharge; denies dysuria and urinary frequency. MS:  Denies joint pain and stiffness. Derm:  Complains of lesion(s) and rash; puritic rash on back and legs relates it penicillin, now on zithromax. Neuro:  Complains of memory loss; denies headaches, seizures, and sensation of room spinning. Psych:  Complains of anxiety, depression, and mental problems; denies suicidal thoughts/plans, thoughts of violence, unusual visions or sounds, and thoughts /plans of harming others. Endo:  Denies cold  intolerance, excessive hunger, excessive thirst, excessive urination, heat intolerance, polyuria, and weight change. Heme:  Denies abnormal bruising and bleeding. Allergy:  Denies hives or rash.  Physical Exam  General:  Well-developed,well-nourished,in no acute distress; alert,appropriate and cooperative throughout examination Head:  Normocephalic and atraumatic without obvious abnormalities. No apparent alopecia or balding. Eyes:  vision grossly intact, pupils equal, and pupils round.   Ears:  External ear exam shows no significant lesions or deformities.  Otoscopic examination reveals clear canals, tympanic  membranes are intact bilaterally without bulging, retraction, inflammation or discharge. Hearing is grossly normal bilaterally. Nose:  External nasal examination shows no deformity or inflammation. Nasal mucosa are pink and moist without lesions or exudates. Mouth:  All  teeth missing.  pharynx pink and moist, poor dentition, and teeth missing.   Neck:  No deformities, masses, or tenderness noted. Chest Wall:  No deformities, masses, or tenderness noted. Breasts:  No mass, nodules, thickening, tenderness, bulging, retraction, inflamation, nipple discharge or skin changes noted.   Lungs:  Normal respiratory effort, chest expands symmetrically. Lungs are clear to auscultation, no crackles or wheezes.Decreased air entry Heart:  Normal rate and regular rhythm. S1 and S2 normal without gallop, murmur, click, rub or other extra sounds. Abdomen:  Bowel sounds positive,abdomen soft and non-tender without masses, organomegaly or hernias noted. Rectal:  No external abnormalities noted. Normal sphincter tone. [place] neg stool, hemmorhoids Genitalia:  Normal introitus for age, no external lesions,white vaginal discharge, mucosa pink and moist, no vaginal or cervical lesions, no vaginal atrophy, no friaility or hemorrhage, normal uterus size and position, no adnexal masses or tenderness Msk:  No deformity or scoliosis noted of thoracic or lumbar spine.   Pulses:  R and L carotid,radial,femoral,dorsalis pedis and posterior tibial pulses are full and equal bilaterally Extremities:  No clubbing, cyanosis, edema, or deformity noted with normal full range of motion of all joints.   Neurologic:  No cranial nerve deficits noted. Station and gait are normal. Plantar reflexes are down-going bilaterally. DTRs are symmetrical throughout. Sensory, motor and coordinative functions appear intact. Skin:  erythematous mcular rash on trunk and extrmeties, none between web spaces of fingers, no purulent drainage Cervical Nodes:   No lymphadenopathy noted Axillary Nodes:  No palpable lymphadenopathy Inguinal Nodes:  No significant adenopathy Psych:  Oriented X3, memory intact for recent and remote, not anxious appearing, and not depressed appearing.     Impression & Recommendations:  Problem # 1:  RASH AND OTHER NONSPECIFIC SKIN ERUPTION (ICD-782.1) Assessment Deteriorated  Orders: Dermatology Referral (Derma)  Problem # 2:  BIPOLAR DISORDER UNSPECIFIED (ICD-296.80) Assessment: Unchanged  Problem # 3:  NICOTINE ADDICTION (ICD-305.1) Assessment: Unchanged  Encouraged smoking cessation and discussed different methods for smoking cessation.   Problem # 4:  PHYSICAL EXAMINATION (ICD-V70.0) Assessment: Comment Only pap sent  Complete Medication List: 1)  Clonazepam 0.5 Mg Tbdp (Clonazepam) .... One half to one tab by mouth as needed for anxiety 2)  Trazodone Hcl 50 Mg Tabs (Trazodone hcl) .... One to two tabs by mouth qhs 3)  Sertraline Hcl 100 Mg Tabs (Sertraline hcl) .... One tab by mouth qd 4)  Fluconazole 150 Mg Tabs (Fluconazole) .... Take 1 tablet by mouth once a day as needed 5)  Doxycycline Hyclate 100 Mg Caps (Doxycycline hyclate) .... Take 1 capsule by mouth two times a day 6)  Fluconazole 150 Mg Tabs (Fluconazole) .... Take one tablet today, and another tablet next week wednesday  Other Orders: T-Wet Prep by Molecular Probe [id number]) T-Chlamydia &  GC Probe, Genital ([id number]) Radiology Referral (Radiology) Pap Smear ([address])  Patient Instructions: 1)  Please schedule a follow-up appointment in 2 months. 2)  Med is sent in for the itch. 3)  you will be referred for a maogram and to see a skin specialist. Prescriptions: FLUCONAZOLE 150 MG TABS (FLUCONAZOLE) take one tablet today, and another tablet next week Wednesday  #2 x 0   Entered and Authorized by:   Syliva Overman MD   Signed by:   Syliva Overman MD on 05/07/2009   Method used:   Electronically to        [facility]  [phone]* (retail)       [address]       [place], [place]  [address]       Ph: [phone] or [phone]       Fax: [phone]   RxID:   [id number]   Laboratory Results  Date/Time Received: May 07, 2009  Date/Time Reported: May 07, 2009   Stool - Occult Blood Hemmoccult #1: negative Date: 05/07/2009 Comments: [id number] 9R 8/11 118 10/12

## 2010-06-10 NOTE — Letter (Signed)
Summary: DR.REDDY  DR.REDDY   Imported By: Lind Guest 04/09/2009 11:14:17  _____________________________________________________________________  External Attachment:    Type:   Image     Comment:   External Document

## 2010-06-10 NOTE — Progress Notes (Signed)
Summary: RASH  Phone Note Call from Patient   Summary of Call: CALL HER SHE HAS A RASH Initial call taken by: Lind Guest,  February 18, 2010 3:32 PM  Follow-up for Phone Call        The only phone number we have for her has been disconnected. She had called in wanting to be checked for herpes and she was advised she needed an OV and was scheduled to come in next week  Follow-up by: Everitt Amber LPN,  February 18, 2010 4:42 PM
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
## 2010-06-11 NOTE — Assessment & Plan Note (Signed)
Summary: OV   Vital Signs:  Patient profile:   45 year old female Menstrual status:  irregular Height:      60 inches Weight:      104.25 pounds BMI:     20.43 Pulse rate:   70 / minute Pulse rhythm:   regular Resp:     16 per minute BP sitting:   120 / 80  (right arm)  Vitals Entered By: Everitt Amber LPN (May 22, 2010 9:24 AM)   Primary Care Provider:  Syliva Overman MD   History of Present Illness: Pt was inn the ED recently for uTI, cipro was prescrbed, still waiting on money to fill this, no fever or chills. She c/o rash nad requests a cream to apply states this will help. Requests that a driver's license application be filled in when she mmails tis in, advised she needs clearance from her psychiatrist, he needs to fill in Tow, she states she has already obtained this, will look out for the form.  Preventive Screening-Counseling & Management  Alcohol-Tobacco     Smoking Cessation Counseling: yes  Allergies (verified): 1)  ! * Pcn  Review of Systems      See HPI General:  Complains of fatigue. Eyes:  Denies discharge and red eye. ENT:  Denies nasal congestion and sinus pressure. CV:  Denies difficulty breathing at night, palpitations, swelling of feet, and swelling of hands. Resp:  Complains of cough; denies sputum productive; smoker's cough. GI:  Denies abdominal pain, constipation, nausea, and vomiting. GU:  Complains of dysuria; denies abnormal vaginal bleeding, discharge, and urinary frequency. Derm:  Complains of dryness, itching, and rash; rash on chest and upper arms for rash, she picks excessively,  scratches, wants a cream. Psych:  Complains of anxiety, depression, and mental problems; denies suicidal thoughts/plans, thoughts of violence, unusual visions or sounds, and thoughts /plans of harming others. Endo:  Denies excessive thirst and excessive urination. Heme:  Denies abnormal bruising and bleeding. Allergy:  Denies hives or rash and itching  eyes.  Physical Exam  General:  Underweight appearing, appears older than stated age,in no acute distress; alert,appropriate and cooperative throughout examination HEENT: No facial asymmetry,  EOMI, No sinus tenderness,  oropharynx  pink and moist.   Chest: Clear to auscultation bilaterally.  CVS: S1, S2, No murmurs, No S3.   Abd: Soft, Nontender.  MS: Adequate ROM spine, hips, shoulders and knees.  Ext: No edema.   CNS: CN 2-12 intact, power tone and sensation normal throughout.   Skin: Intact, scratch marks, erythematous punctate lesions, no purulent drainage, fungal infection noted on upper ext Psych: Good eye contact,    Impression & Recommendations:  Problem # 1:  UNSPECIFIED CYSTITIS (ICD-595.9) Assessment Comment Only  The following medications were removed from the medication list:    Doxycycline Hyclate 100 Mg Caps (Doxycycline hyclate) .Marland Kitchen... Take 1 capsule by mouth two times a day has script for cipro to be filled , was given at ED Orders: UA Dipstick W/ Micro (manual) (16109) T-Culture, Urine ([id number])  Encouraged to push clear liquids, get enough rest, and take acetaminophen as needed. To be seen in 10 days if no improvement, sooner if worse.  Problem # 2:  DERMATOMYCOSIS (ICD-111.9) Assessment: Comment Only  Her updated medication list for this problem includes:    Clotrimazole-betamethasone 1-0.05 % Crea (Clotrimazole-betamethasone) .Marland Kitchen... Apply twice daily to rash  Problem # 3:  NICOTINE ADDICTION (ICD-305.1) Assessment: Unchanged  Encouraged smoking cessation and discussed different methods for smoking cessation.  Complete Medication List: 1)  Clonazepam 0.5 Mg Tbdp (Clonazepam) .... One half to one tab by mouth as needed for anxiety 2)  Trazodone Hcl 50 Mg Tabs (Trazodone hcl) .... One to two tabs by mouth at bedtime. 3)  Sertraline Hcl 100 Mg Tabs (Sertraline hcl) .... One tab by mouth once daily. 4)  Clotrimazole-betamethasone 1-0.05 % Crea  (Clotrimazole-betamethasone) .... Apply twice daily to rash 5)  Hydroxyzine Hcl 25 Mg Tabs (Hydroxyzine hcl) .... Take 1 tab by mouth at bedtime  Other Orders: Medicare Electronic Prescription [phone]) T-Basic Metabolic Panel [phone]) T-Lipid Profile [phone]) T-CBC w/Diff [phone]) T-TSH [phone]) T-Vitamin D (25-Hydroxy) [phone]) Urine Pregnancy Test  (36644)  Patient Instructions: 1)  Please schedule a follow-up appointment in 3 months. 2)  Tobacco is very bad for your health and your loved ones! You Should stop smoking!. 3)  Stop Smoking Tips: Choose a Quit date. Cut down before the Quit date. decide what you will do as a substitute when you feel the urge to smoke(gum,toothpick,exercise). 4)  meds are sent in for the rash and to help you to sleep. 5)  you need to fill the med for your uTI 6)  BMP prior to visit, ICD-9: 7)  Lipid Panel prior to visit, ICD-9: 8)  TSH prior to visit, ICD-9:   today 9)  CBC w/ Diff prior to visit, ICD-9: 10)  vit d Prescriptions: HYDROXYZINE HCL 25 MG TABS (HYDROXYZINE HCL) Take 1 tab by mouth at bedtime  #30 x 1   Entered and Authorized by:   Syliva Overman MD   Signed by:   Syliva Overman MD on 05/22/2010   Method used:   Electronically to        [facility]* (retail)       [address] [place], [place]  [address]       Ph: [phone]       Fax: [phone]   RxID:   [id number] CLOTRIMAZOLE-BETAMETHASONE 1-0.05 % CREA (CLOTRIMAZOLE-BETAMETHASONE) apply twice daily to rash  #30gm x 1   Entered and Authorized by:   Syliva Overman MD   Signed by:   Syliva Overman MD on 05/22/2010   Method used:   Electronically to        [facility]* (retail)       [address]       [place], [place]  [address]       Ph: [phone]       Fax: [phone]   RxID:   [id number]    Orders Added: 1)  Est. Patient Level IV [46270] 2)   Medicare Electronic Prescription [G8553] 3)  T-Basic Metabolic Panel [80048-22910] 4)  T-Lipid Profile [80061-22930] 5)  T-CBC w/Diff [35009-38182] 6)  T-TSH [99371-69678] 7)  T-Vitamin D (25-Hydroxy) [93810-17510] 8)  UA Dipstick W/ Micro (manual) [81000] 9)  Urine Pregnancy Test  [81025] 10)  T-Culture, Urine [[id number]]     Orders Added: 1)  Est. Patient Level IV [23536] 2)  Medicare Electronic Prescription [G8553] 3)  T-Basic Metabolic Panel [80048-22910] 4)  T-Lipid Profile [80061-22930] 5)  T-CBC w/Diff [14431-54008] 6)  T-TSH [67619-50932] 7)  T-Vitamin D (25-Hydroxy) [67124-58099] 8)  UA Dipstick W/ Micro (manual) [81000] 9)  Urine Pregnancy Test  [81025] 10)  T-Culture, Urine [[id number]]   Laboratory Results   Urine Tests    Routine Urinalysis   Color:  lt. yellow Appearance: Clear Glucose: negative   (Normal Range: Negative) Bilirubin: small   (Normal Range: Negative) Ketone: trace (5)   (Normal Range: Negative) Spec. Gravity: 1.020   (Normal Range: 1.003-1.035) Blood: negative   (Normal Range: Negative) pH: 6.0   (Normal Range: 5.0-8.0) Protein: 30   (Normal Range: Negative) Urobilinogen: 0.2   (Normal Range: 0-1) Nitrite: negative   (Normal Range: Negative) Leukocyte Esterace: moderate   (Normal Range: Negative)    Urine HCG: negative

## 2010-06-16 ENCOUNTER — Telehealth: Payer: Self-pay | Admitting: Family Medicine

## 2010-06-22 ENCOUNTER — Telehealth: Payer: Self-pay | Admitting: Family Medicine

## 2010-06-25 NOTE — Progress Notes (Signed)
Summary: speak with nurse  Phone Note Call from Patient   Summary of Call: skin rash is on face. pt is not scratching and needs cream. [phone]  Initial call taken by: Rudene Anda,  June 16, 2010 4:42 PM  Follow-up for Phone Call        recommend she sees dermatologyu, i do not like to prescribe creams for the face,let m,e know if she agrees so I can refer Follow-up by: Syliva Overman MD,  June 16, 2010 4:49 PM  Additional Follow-up for Phone Call Additional follow up Details #1::        returned call, left message Additional Follow-up by: Adella Hare LPN,  June 17, 2010 9:07 AM    Additional Follow-up for Phone Call Additional follow up Details #2::    # disconnected Follow-up by: Adella Hare LPN,  June 18, 2010 2:22 PM

## 2010-07-01 NOTE — Progress Notes (Signed)
  Phone Note Other Incoming   Caller: dr Genesi Stefanko Summary of Call: her psychiatrist has completed and signed theform, I prescribe no meds for her, she needs to fill in page 1 and mail, there is nowhere I have to fill in, It was signed off by psych since Jan 4, pls scan pg 2 in her record. Info in your  box  Initial call taken by: Syliva Overman MD,  June 22, 2010 6:04 PM  Follow-up for Phone Call        patient is aware is going to call back to mail or she is going to pick up Follow-up by: Lind Guest,  June 23, 2010 12:42 PM

## 2010-07-23 LAB — POCT I-STAT, CHEM 8
BUN: 6 mg/dL (ref 6–23)
Calcium, Ion: 1.05 mmol/L — ABNORMAL LOW (ref 1.12–1.32)
Chloride: 104 mEq/L (ref 96–112)
Creatinine, Ser: 0.6 mg/dL (ref 0.4–1.2)
Glucose, Bld: 97 mg/dL (ref 70–99)
HCT: 39 % (ref 36.0–46.0)
Hemoglobin: 13.3 g/dL (ref 12.0–15.0)
Potassium: 3.2 mEq/L — ABNORMAL LOW (ref 3.5–5.1)
Sodium: 137 mEq/L (ref 135–145)
TCO2: 22 mmol/L (ref 0–100)

## 2010-08-19 ENCOUNTER — Encounter: Payer: Self-pay | Admitting: Family Medicine

## 2010-08-20 ENCOUNTER — Encounter: Payer: Self-pay | Admitting: Family Medicine

## 2010-08-21 ENCOUNTER — Encounter: Payer: Self-pay | Admitting: Family Medicine

## 2010-08-21 ENCOUNTER — Ambulatory Visit: Payer: Self-pay | Admitting: Family Medicine

## 2010-08-27 ENCOUNTER — Encounter: Payer: Self-pay | Admitting: Family Medicine

## 2010-09-20 ENCOUNTER — Emergency Department (HOSPITAL_COMMUNITY)
Admission: EM | Admit: 2010-09-20 | Discharge: 2010-09-21 | Discharge status: Against Medical Advice | Payer: Medicare Other | Attending: Emergency Medicine | Admitting: Emergency Medicine

## 2010-09-20 DIAGNOSIS — R079 Chest pain, unspecified: Secondary | ICD-10-CM | POA: Insufficient documentation

## 2010-09-20 DIAGNOSIS — F17200 Nicotine dependence, unspecified, uncomplicated: Secondary | ICD-10-CM | POA: Insufficient documentation

## 2010-09-20 DIAGNOSIS — N949 Unspecified condition associated with female genital organs and menstrual cycle: Secondary | ICD-10-CM | POA: Insufficient documentation

## 2010-09-20 DIAGNOSIS — M4120 Other idiopathic scoliosis, site unspecified: Secondary | ICD-10-CM | POA: Insufficient documentation

## 2010-09-21 LAB — WET PREP, GENITAL
Trich, Wet Prep: NONE SEEN
Yeast Wet Prep HPF POC: NONE SEEN

## 2010-09-22 LAB — GC/CHLAMYDIA PROBE AMP, GENITAL
Chlamydia, DNA Probe: NEGATIVE
GC Probe Amp, Genital: NEGATIVE

## 2010-09-24 ENCOUNTER — Emergency Department (HOSPITAL_COMMUNITY)
Admission: EM | Admit: 2010-09-24 | Discharge: 2010-09-25 | Disposition: A | Discharge status: Home / Self Care | Payer: Medicare Other | Attending: Emergency Medicine | Admitting: Emergency Medicine

## 2010-09-24 DIAGNOSIS — T424X4A Poisoning by benzodiazepines, undetermined, initial encounter: Secondary | ICD-10-CM | POA: Insufficient documentation

## 2010-09-24 DIAGNOSIS — Z79899 Other long term (current) drug therapy: Secondary | ICD-10-CM | POA: Insufficient documentation

## 2010-09-24 DIAGNOSIS — T424X1A Poisoning by benzodiazepines, accidental (unintentional), initial encounter: Secondary | ICD-10-CM | POA: Insufficient documentation

## 2010-09-24 DIAGNOSIS — F411 Generalized anxiety disorder: Secondary | ICD-10-CM | POA: Insufficient documentation

## 2010-09-24 DIAGNOSIS — T48204A Poisoning by unspecified drugs acting on muscles, undetermined, initial encounter: Secondary | ICD-10-CM | POA: Insufficient documentation

## 2010-09-24 DIAGNOSIS — T5191XA Toxic effect of unspecified alcohol, accidental (unintentional), initial encounter: Secondary | ICD-10-CM | POA: Insufficient documentation

## 2010-09-24 DIAGNOSIS — F329 Major depressive disorder, single episode, unspecified: Secondary | ICD-10-CM | POA: Insufficient documentation

## 2010-09-24 DIAGNOSIS — T48201A Poisoning by unspecified drugs acting on muscles, accidental (unintentional), initial encounter: Secondary | ICD-10-CM | POA: Insufficient documentation

## 2010-09-24 DIAGNOSIS — R4182 Altered mental status, unspecified: Secondary | ICD-10-CM | POA: Insufficient documentation

## 2010-09-24 DIAGNOSIS — F3289 Other specified depressive episodes: Secondary | ICD-10-CM | POA: Insufficient documentation

## 2010-09-24 LAB — CBC
HCT: 36.1 % (ref 36.0–46.0)
Hemoglobin: 12 g/dL (ref 12.0–15.0)
MCH: 32.2 pg (ref 26.0–34.0)
MCHC: 33.2 g/dL (ref 30.0–36.0)
MCV: 96.8 fL (ref 78.0–100.0)
Platelets: 163 10*3/uL (ref 150–400)
RBC: 3.73 MIL/uL — ABNORMAL LOW (ref 3.87–5.11)
RDW: 13.4 % (ref 11.5–15.5)
WBC: 6.8 10*3/uL (ref 4.0–10.5)

## 2010-09-24 LAB — DIFFERENTIAL
Basophils Absolute: 0 10*3/uL (ref 0.0–0.1)
Basophils Relative: 0 % (ref 0–1)
Eosinophils Absolute: 0.2 10*3/uL (ref 0.0–0.7)
Eosinophils Relative: 3 % (ref 0–5)
Lymphocytes Relative: 44 % (ref 12–46)
Lymphs Abs: 2.9 10*3/uL (ref 0.7–4.0)
Monocytes Absolute: 0.5 10*3/uL (ref 0.1–1.0)
Monocytes Relative: 7 % (ref 3–12)
Neutro Abs: 3.1 10*3/uL (ref 1.7–7.7)
Neutrophils Relative %: 46 % (ref 43–77)

## 2010-09-24 LAB — URINALYSIS, ROUTINE W REFLEX MICROSCOPIC
Bilirubin Urine: NEGATIVE
Glucose, UA: NEGATIVE mg/dL
Hgb urine dipstick: NEGATIVE
Ketones, ur: NEGATIVE mg/dL
Nitrite: NEGATIVE
Protein, ur: NEGATIVE mg/dL
Specific Gravity, Urine: <1.005 — ABNORMAL LOW (ref 1.005–1.030)
Urobilinogen, UA: 0.2 mg/dL (ref 0.0–1.0)
pH: 6 (ref 5.0–8.0)

## 2010-09-24 LAB — COMPREHENSIVE METABOLIC PANEL
ALT: 14 U/L (ref 0–35)
AST: 23 U/L (ref 0–37)
Albumin: 4 g/dL (ref 3.5–5.2)
Alkaline Phosphatase: 59 U/L (ref 39–117)
BUN: 5 mg/dL — ABNORMAL LOW (ref 6–23)
CO2: 21 mEq/L (ref 19–32)
Calcium: 9.3 mg/dL (ref 8.4–10.5)
Chloride: 107 mEq/L (ref 96–112)
Creatinine, Ser: 0.57 mg/dL (ref 0.4–1.2)
GFR calc Af Amer: >60 mL/min (ref >60)
GFR calc non Af Amer: >60 mL/min (ref >60)
Glucose, Bld: 74 mg/dL (ref 70–99)
Potassium: 3.1 mEq/L — ABNORMAL LOW (ref 3.5–5.1)
Sodium: 140 mEq/L (ref 135–145)
Total Bilirubin: 0.2 mg/dL — ABNORMAL LOW (ref 0.3–1.2)
Total Protein: 6.8 g/dL (ref 6.0–8.3)

## 2010-09-24 LAB — ACETAMINOPHEN LEVEL: Acetaminophen (Tylenol), Serum: <15 ug/mL (ref 10–30)

## 2010-09-24 LAB — URINE MICROSCOPIC-ADD ON

## 2010-09-24 LAB — SALICYLATE LEVEL: Salicylate Lvl: <2 mg/dL — ABNORMAL LOW (ref 2.8–20.0)

## 2010-09-24 LAB — RAPID URINE DRUG SCREEN, HOSP PERFORMED
Amphetamines: NOT DETECTED
Barbiturates: NOT DETECTED
Benzodiazepines: NOT DETECTED
Cocaine: NOT DETECTED
Opiates: NOT DETECTED
Tetrahydrocannabinol: NOT DETECTED

## 2010-09-24 LAB — POCT PREGNANCY, URINE: Preg Test, Ur: NEGATIVE

## 2010-09-24 LAB — ETHANOL: Alcohol, Ethyl (B): 107 mg/dL — ABNORMAL HIGH (ref 0–10)

## 2010-09-25 LAB — GLUCOSE, CAPILLARY: Glucose-Capillary: 75 mg/dL (ref 70–99)

## 2010-10-04 ENCOUNTER — Emergency Department (HOSPITAL_COMMUNITY)
Admission: EM | Admit: 2010-10-04 | Discharge: 2010-10-05 | Disposition: A | Discharge status: Home / Self Care | Payer: Medicare Other | Attending: Emergency Medicine | Admitting: Emergency Medicine

## 2010-10-04 DIAGNOSIS — F341 Dysthymic disorder: Secondary | ICD-10-CM | POA: Insufficient documentation

## 2010-10-04 DIAGNOSIS — R5383 Other fatigue: Secondary | ICD-10-CM | POA: Insufficient documentation

## 2010-10-04 DIAGNOSIS — R5381 Other malaise: Secondary | ICD-10-CM | POA: Insufficient documentation

## 2010-10-04 DIAGNOSIS — R110 Nausea: Secondary | ICD-10-CM | POA: Insufficient documentation

## 2010-10-04 DIAGNOSIS — E860 Dehydration: Secondary | ICD-10-CM | POA: Insufficient documentation

## 2010-10-04 LAB — URINALYSIS, ROUTINE W REFLEX MICROSCOPIC
Bilirubin Urine: NEGATIVE
Glucose, UA: NEGATIVE mg/dL
Hgb urine dipstick: NEGATIVE
Ketones, ur: NEGATIVE mg/dL
Nitrite: NEGATIVE
Protein, ur: NEGATIVE mg/dL
Specific Gravity, Urine: 1.01 (ref 1.005–1.030)
Urobilinogen, UA: 0.2 mg/dL (ref 0.0–1.0)
pH: 7 (ref 5.0–8.0)

## 2010-10-04 LAB — PREGNANCY, URINE: Preg Test, Ur: NEGATIVE

## 2010-10-05 LAB — DIFFERENTIAL
Basophils Absolute: 0 10*3/uL (ref 0.0–0.1)
Basophils Relative: 0 % (ref 0–1)
Eosinophils Absolute: 0.3 10*3/uL (ref 0.0–0.7)
Eosinophils Relative: 3 % (ref 0–5)
Lymphocytes Relative: 36 % (ref 12–46)
Lymphs Abs: 2.9 10*3/uL (ref 0.7–4.0)
Monocytes Absolute: 0.6 10*3/uL (ref 0.1–1.0)
Monocytes Relative: 7 % (ref 3–12)
Neutro Abs: 4.4 10*3/uL (ref 1.7–7.7)
Neutrophils Relative %: 54 % (ref 43–77)

## 2010-10-05 LAB — BASIC METABOLIC PANEL
BUN: 11 mg/dL (ref 6–23)
CO2: 25 mEq/L (ref 19–32)
Calcium: 9.1 mg/dL (ref 8.4–10.5)
Chloride: 105 mEq/L (ref 96–112)
Creatinine, Ser: 0.69 mg/dL (ref 0.4–1.2)
GFR calc Af Amer: >60 mL/min (ref >60)
GFR calc non Af Amer: >60 mL/min (ref >60)
Glucose, Bld: 92 mg/dL (ref 70–99)
Potassium: 3.4 mEq/L — ABNORMAL LOW (ref 3.5–5.1)
Sodium: 138 mEq/L (ref 135–145)

## 2010-10-05 LAB — CBC
HCT: 37.5 % (ref 36.0–46.0)
Hemoglobin: 12.4 g/dL (ref 12.0–15.0)
MCH: 32 pg (ref 26.0–34.0)
MCHC: 33.1 g/dL (ref 30.0–36.0)
MCV: 96.6 fL (ref 78.0–100.0)
Platelets: 180 10*3/uL (ref 150–400)
RBC: 3.88 MIL/uL (ref 3.87–5.11)
RDW: 13.9 % (ref 11.5–15.5)
WBC: 8.2 10*3/uL (ref 4.0–10.5)

## 2010-11-03 ENCOUNTER — Emergency Department (HOSPITAL_COMMUNITY)
Admission: EM | Admit: 2010-11-03 | Discharge: 2010-11-04 | Disposition: A | Discharge status: Home / Self Care | Payer: Medicare Other | Attending: Emergency Medicine | Admitting: Emergency Medicine

## 2010-11-03 DIAGNOSIS — N898 Other specified noninflammatory disorders of vagina: Secondary | ICD-10-CM | POA: Insufficient documentation

## 2010-11-03 DIAGNOSIS — F17200 Nicotine dependence, unspecified, uncomplicated: Secondary | ICD-10-CM | POA: Insufficient documentation

## 2010-11-03 DIAGNOSIS — F341 Dysthymic disorder: Secondary | ICD-10-CM | POA: Insufficient documentation

## 2010-11-03 LAB — WET PREP, GENITAL
Clue Cells Wet Prep HPF POC: NONE SEEN
Trich, Wet Prep: NONE SEEN
WBC, Wet Prep HPF POC: NONE SEEN
Yeast Wet Prep HPF POC: NONE SEEN

## 2010-11-05 LAB — GC/CHLAMYDIA PROBE AMP, GENITAL
Chlamydia, DNA Probe: NEGATIVE
GC Probe Amp, Genital: NEGATIVE

## 2010-12-16 ENCOUNTER — Encounter (HOSPITAL_COMMUNITY): Payer: Self-pay | Admitting: *Deleted

## 2010-12-16 ENCOUNTER — Emergency Department (HOSPITAL_COMMUNITY)
Admission: EM | Admit: 2010-12-16 | Discharge: 2010-12-16 | Disposition: A | Discharge status: Home / Self Care | Payer: Medicare Other | Attending: Emergency Medicine | Admitting: Emergency Medicine

## 2010-12-16 DIAGNOSIS — F17200 Nicotine dependence, unspecified, uncomplicated: Secondary | ICD-10-CM | POA: Insufficient documentation

## 2010-12-16 DIAGNOSIS — T43205A Adverse effect of unspecified antidepressants, initial encounter: Secondary | ICD-10-CM | POA: Insufficient documentation

## 2010-12-16 DIAGNOSIS — T50995A Adverse effect of other drugs, medicaments and biological substances, initial encounter: Secondary | ICD-10-CM | POA: Insufficient documentation

## 2010-12-16 DIAGNOSIS — T887XXA Unspecified adverse effect of drug or medicament, initial encounter: Secondary | ICD-10-CM

## 2010-12-16 DIAGNOSIS — Y92009 Unspecified place in unspecified non-institutional (private) residence as the place of occurrence of the external cause: Secondary | ICD-10-CM | POA: Insufficient documentation

## 2010-12-16 NOTE — ED Notes (Signed)
Pt very beligernt about having to leave the er swearing at staff, security called to escort pt to waITING ROOM

## 2010-12-16 NOTE — ED Notes (Signed)
Pt sleeping in bed no noted distress no stated needs

## 2010-12-16 NOTE — ED Notes (Signed)
Pt sleeping in bed in room no noted distress. 

## 2010-12-16 NOTE — ED Notes (Signed)
C/o anxiety and right foot pain, pt ambulating in waiting room and attempting to get something to drink out of drink machines

## 2010-12-16 NOTE — ED Notes (Signed)
Unable to keep pt awake, charge nurse notified we will keep pt in er until later int he night until she can call for a cab ride home

## 2010-12-16 NOTE — ED Provider Notes (Signed)
History     CSN: [id number] Arrival date & time: 12/16/2010  2:08 AM  Chief Complaint  Patient presents with  . Anxiety   HPI Comments: Patient states the medication she takes at night makes her sleepy. She has no other complaints and when I ask her if there is anything that she wants she says now and just wants to go home. She has no complaints at this time  Patient is a 45 y.o. female presenting with anxiety. The history is provided by the patient.  Anxiety Pertinent negatives include no headaches and no shortness of breath.    Past Medical History  Diagnosis Date  . Bipolar disease, chronic 2000    Dr. Betti Cruz /   . Suicide attempt   . Spider bite 2008    Seen in the ED   . Narcotic addiction   . Alcohol addiction     Past Surgical History  Procedure Date  . Cesarean section 2004  . Extraction of teeth 2010    Family History  Problem Relation Age of Onset  . Heart disease Mother   . Hypertension Mother   . Diabetes Father   . Anxiety disorder Father   . Anxiety disorder Sister     History  Substance Use Topics  . Smoking status: Current Everyday Smoker -- 2.0 packs/day    Types: Cigarettes  . Smokeless tobacco: Not on file  . Alcohol Use: Yes     patient said she quit approx 2 years, did have an addiction problem     OB History    Grav Para Term Preterm Abortions TAB SAB Ect Mult Living                  Review of Systems  Constitutional: Negative for fever.  HENT: Negative for neck pain.   Eyes: Negative for visual disturbance.  Respiratory: Negative for cough and shortness of breath.   Gastrointestinal: Negative for nausea and vomiting.  Genitourinary: Negative for dysuria.  Musculoskeletal: Negative for back pain.  Skin: Negative for rash.  Neurological: Negative for numbness and headaches.  Hematological: Negative for adenopathy.  Psychiatric/Behavioral: Negative for confusion and agitation.    Physical Exam  BP 112/80  Pulse 95  Temp(Src)  97.3 F (36.3 C) (Oral)  Resp 17  Ht 5' (1.524 m)  Wt 100 lb (45.36 kg)  BMI 19.53 kg/m2  SpO2 100%  LMP 12/09/2010  Physical Exam  Nursing note and vitals reviewed. Constitutional: She appears well-developed and well-nourished. No distress.  HENT:  Head: Normocephalic and atraumatic.  Mouth/Throat: Oropharynx is clear and moist. No oropharyngeal exudate.  Eyes: Conjunctivae and EOM are normal. Pupils are equal, round, and reactive to light. Right eye exhibits no discharge. Left eye exhibits no discharge. No scleral icterus.  Neck: Normal range of motion. Neck supple. No JVD present. No thyromegaly present.  Cardiovascular: Normal rate, regular rhythm, normal heart sounds and intact distal pulses.  Exam reveals no gallop and no friction rub.   No murmur heard. Pulmonary/Chest: Effort normal and breath sounds normal. No respiratory distress. She has no wheezes. She has no rales.  Abdominal: Soft. Bowel sounds are normal. She exhibits no distension and no mass. There is no tenderness.  Musculoskeletal: Normal range of motion. She exhibits no edema and no tenderness.  Lymphadenopathy:    She has no cervical adenopathy.  Neurological:       Sleepy but arousable.  Skin: Skin is warm and dry. No rash noted. No erythema.  Psychiatric: She has a normal mood and affect. Her behavior is normal.    ED Course  Procedures  MDM Patient appears sleepy but has otherwise normal exam. Takes 300 mg of trazodone at bedtime. Will advise patient to reduce the amount at bedtime.      Vida Roller, MD 12/16/10 [phone]

## 2011-02-09 LAB — BASIC METABOLIC PANEL
BUN: 11
CO2: 25
Calcium: 8.4
Chloride: 103
Creatinine, Ser: 0.66
GFR calc Af Amer: >60
GFR calc non Af Amer: >60
Glucose, Bld: 120 — ABNORMAL HIGH
Potassium: 3.3 — ABNORMAL LOW
Sodium: 136

## 2011-02-09 LAB — CBC
HCT: 35.7 — ABNORMAL LOW
Hemoglobin: 12
MCHC: 33.7
MCV: 99
Platelets: 214
RBC: 3.61 — ABNORMAL LOW
RDW: 13.9
WBC: 10.9 — ABNORMAL HIGH

## 2011-02-09 LAB — PREGNANCY, URINE: Preg Test, Ur: NEGATIVE

## 2011-02-09 LAB — DIFFERENTIAL
Basophils Absolute: 0
Basophils Relative: 0
Eosinophils Absolute: 0.4
Eosinophils Relative: 4
Lymphocytes Relative: 28
Lymphs Abs: 3
Monocytes Absolute: 0.6
Monocytes Relative: 6
Neutro Abs: 6.8
Neutrophils Relative %: 62

## 2011-02-09 LAB — URINALYSIS, ROUTINE W REFLEX MICROSCOPIC
Bilirubin Urine: NEGATIVE
Glucose, UA: NEGATIVE
Hgb urine dipstick: NEGATIVE
Nitrite: NEGATIVE
Protein, ur: NEGATIVE
Specific Gravity, Urine: 1.025
Urobilinogen, UA: 0.2
pH: 5

## 2011-02-24 LAB — HIV ANTIBODY (ROUTINE TESTING W REFLEX): HIV: NONREACTIVE

## 2011-02-24 LAB — URINALYSIS, ROUTINE W REFLEX MICROSCOPIC
Glucose, UA: NEGATIVE
Hgb urine dipstick: NEGATIVE
Ketones, ur: 40 — AB
Nitrite: NEGATIVE
Protein, ur: NEGATIVE
Specific Gravity, Urine: 1.02
Urobilinogen, UA: 0.2
pH: 6

## 2011-02-24 LAB — CBC
HCT: 37.8
Hemoglobin: 13.1
MCHC: 34.5
MCV: 89.9
Platelets: 275
RBC: 4.21
RDW: 15.9 — ABNORMAL HIGH
WBC: 14.9 — ABNORMAL HIGH

## 2011-02-24 LAB — GC/CHLAMYDIA PROBE AMP, GENITAL
Chlamydia, DNA Probe: NEGATIVE
GC Probe Amp, Genital: NEGATIVE

## 2011-02-24 LAB — BASIC METABOLIC PANEL
BUN: 4 — ABNORMAL LOW
CO2: 22
Calcium: 9.5
Chloride: 106
Creatinine, Ser: 0.72
GFR calc Af Amer: >60
GFR calc non Af Amer: >60
Glucose, Bld: 101 — ABNORMAL HIGH
Potassium: 3.4 — ABNORMAL LOW
Sodium: 138

## 2011-02-24 LAB — DIFFERENTIAL
Basophils Absolute: 0.1
Basophils Relative: 1
Eosinophils Absolute: 0.2
Eosinophils Relative: 1
Lymphocytes Relative: 23
Lymphs Abs: 3.4 — ABNORMAL HIGH
Monocytes Absolute: 0.9 — ABNORMAL HIGH
Monocytes Relative: 6
Neutro Abs: 10.3 — ABNORMAL HIGH
Neutrophils Relative %: 69

## 2011-02-24 LAB — RAPID URINE DRUG SCREEN, HOSP PERFORMED
Amphetamines: NOT DETECTED
Barbiturates: NOT DETECTED
Benzodiazepines: NOT DETECTED
Cocaine: NOT DETECTED
Opiates: NOT DETECTED
Tetrahydrocannabinol: NOT DETECTED

## 2011-02-24 LAB — WET PREP, GENITAL
Trich, Wet Prep: NONE SEEN
WBC, Wet Prep HPF POC: NONE SEEN
Yeast Wet Prep HPF POC: NONE SEEN

## 2011-02-24 LAB — PREGNANCY, URINE: Preg Test, Ur: NEGATIVE

## 2011-02-24 LAB — ACETAMINOPHEN LEVEL: Acetaminophen (Tylenol), Serum: <10 — ABNORMAL LOW

## 2011-02-24 LAB — SALICYLATE LEVEL: Salicylate Lvl: <4

## 2011-02-24 LAB — ETHANOL: Alcohol, Ethyl (B): <5

## 2011-04-16 ENCOUNTER — Encounter: Payer: Self-pay | Admitting: Family Medicine

## 2011-04-23 ENCOUNTER — Ambulatory Visit: Payer: Medicare Other | Admitting: Family Medicine

## 2011-05-09 ENCOUNTER — Emergency Department (HOSPITAL_COMMUNITY)
Admission: EM | Admit: 2011-05-09 | Discharge: 2011-05-10 | Disposition: A | Discharge status: Home / Self Care | Payer: Medicare Other | Attending: Emergency Medicine | Admitting: Emergency Medicine

## 2011-05-09 ENCOUNTER — Encounter (HOSPITAL_COMMUNITY): Payer: Self-pay

## 2011-05-09 DIAGNOSIS — F410 Panic disorder [episodic paroxysmal anxiety] without agoraphobia: Secondary | ICD-10-CM | POA: Insufficient documentation

## 2011-05-09 DIAGNOSIS — F17200 Nicotine dependence, unspecified, uncomplicated: Secondary | ICD-10-CM | POA: Insufficient documentation

## 2011-05-09 DIAGNOSIS — F3289 Other specified depressive episodes: Secondary | ICD-10-CM | POA: Insufficient documentation

## 2011-05-09 DIAGNOSIS — F411 Generalized anxiety disorder: Secondary | ICD-10-CM | POA: Insufficient documentation

## 2011-05-09 DIAGNOSIS — F1910 Other psychoactive substance abuse, uncomplicated: Secondary | ICD-10-CM | POA: Insufficient documentation

## 2011-05-09 DIAGNOSIS — F329 Major depressive disorder, single episode, unspecified: Secondary | ICD-10-CM

## 2011-05-09 HISTORY — DX: Palpitations: R00.2

## 2011-05-09 HISTORY — DX: Panic disorder (episodic paroxysmal anxiety): F41.0

## 2011-05-09 HISTORY — DX: Scabies: B86

## 2011-05-09 HISTORY — DX: Anxiety disorder, unspecified: F41.9

## 2011-05-09 LAB — URINALYSIS, ROUTINE W REFLEX MICROSCOPIC
Bilirubin Urine: NEGATIVE
Glucose, UA: NEGATIVE mg/dL
Ketones, ur: NEGATIVE mg/dL
Leukocytes, UA: NEGATIVE
Nitrite: NEGATIVE
Protein, ur: NEGATIVE mg/dL
Specific Gravity, Urine: 1.01 (ref 1.005–1.030)
Urobilinogen, UA: 0.2 mg/dL (ref 0.0–1.0)
pH: 6 (ref 5.0–8.0)

## 2011-05-09 LAB — DIFFERENTIAL
Basophils Absolute: 0.1 10*3/uL (ref 0.0–0.1)
Basophils Relative: 0 % (ref 0–1)
Eosinophils Absolute: 0.3 10*3/uL (ref 0.0–0.7)
Eosinophils Relative: 1 % (ref 0–5)
Lymphocytes Relative: 11 % — ABNORMAL LOW (ref 12–46)
Lymphs Abs: 2.3 10*3/uL (ref 0.7–4.0)
Monocytes Absolute: 1 10*3/uL (ref 0.1–1.0)
Monocytes Relative: 5 % (ref 3–12)
Neutro Abs: 16.7 10*3/uL — ABNORMAL HIGH (ref 1.7–7.7)
Neutrophils Relative %: 82 % — ABNORMAL HIGH (ref 43–77)

## 2011-05-09 LAB — BASIC METABOLIC PANEL
BUN: 8 mg/dL (ref 6–23)
CO2: 27 mEq/L (ref 19–32)
Calcium: 9.2 mg/dL (ref 8.4–10.5)
Chloride: 100 mEq/L (ref 96–112)
Creatinine, Ser: 0.6 mg/dL (ref 0.50–1.10)
GFR calc Af Amer: >90 mL/min (ref >90)
GFR calc non Af Amer: >90 mL/min (ref >90)
Glucose, Bld: 105 mg/dL — ABNORMAL HIGH (ref 70–99)
Potassium: 3.4 mEq/L — ABNORMAL LOW (ref 3.5–5.1)
Sodium: 136 mEq/L (ref 135–145)

## 2011-05-09 LAB — URINE MICROSCOPIC-ADD ON

## 2011-05-09 LAB — RAPID URINE DRUG SCREEN, HOSP PERFORMED
Amphetamines: NOT DETECTED
Barbiturates: NOT DETECTED
Benzodiazepines: NOT DETECTED
Cocaine: NOT DETECTED
Opiates: NOT DETECTED
Tetrahydrocannabinol: NOT DETECTED

## 2011-05-09 LAB — CBC
HCT: 38.1 % (ref 36.0–46.0)
Hemoglobin: 12.8 g/dL (ref 12.0–15.0)
MCH: 33.6 pg (ref 26.0–34.0)
MCHC: 33.6 g/dL (ref 30.0–36.0)
MCV: 100 fL (ref 78.0–100.0)
Platelets: 211 10*3/uL (ref 150–400)
RBC: 3.81 MIL/uL — ABNORMAL LOW (ref 3.87–5.11)
RDW: 13.5 % (ref 11.5–15.5)
WBC: 20.3 10*3/uL — ABNORMAL HIGH (ref 4.0–10.5)

## 2011-05-09 LAB — PREGNANCY, URINE: Preg Test, Ur: NEGATIVE

## 2011-05-09 LAB — ETHANOL: Alcohol, Ethyl (B): <11 mg/dL (ref 0–11)

## 2011-05-09 NOTE — ED Notes (Signed)
Was dropped off by police --pt reports that she is very depressed for months, having thoughts of hurting herself, no plan, thinks meds need to be changed, md will not change them, crying all the time and has "fits" a times, "like a volcano".

## 2011-05-10 ENCOUNTER — Emergency Department (HOSPITAL_COMMUNITY)
Admission: EM | Admit: 2011-05-10 | Discharge: 2011-05-11 | Disposition: A | Discharge status: Other Acute Inpatient Hospital | Payer: Medicare Other | Attending: Emergency Medicine | Admitting: Emergency Medicine

## 2011-05-10 ENCOUNTER — Encounter (HOSPITAL_COMMUNITY): Payer: Self-pay | Admitting: *Deleted

## 2011-05-10 DIAGNOSIS — F319 Bipolar disorder, unspecified: Secondary | ICD-10-CM | POA: Insufficient documentation

## 2011-05-10 DIAGNOSIS — F1110 Opioid abuse, uncomplicated: Secondary | ICD-10-CM | POA: Insufficient documentation

## 2011-05-10 DIAGNOSIS — F1021 Alcohol dependence, in remission: Secondary | ICD-10-CM | POA: Insufficient documentation

## 2011-05-10 DIAGNOSIS — R45851 Suicidal ideations: Secondary | ICD-10-CM | POA: Insufficient documentation

## 2011-05-10 DIAGNOSIS — F411 Generalized anxiety disorder: Secondary | ICD-10-CM | POA: Insufficient documentation

## 2011-05-10 DIAGNOSIS — F17200 Nicotine dependence, unspecified, uncomplicated: Secondary | ICD-10-CM | POA: Insufficient documentation

## 2011-05-10 LAB — CBC
HCT: 39.7 % (ref 36.0–46.0)
Hemoglobin: 13.9 g/dL (ref 12.0–15.0)
MCH: 34.2 pg — ABNORMAL HIGH (ref 26.0–34.0)
MCHC: 35 g/dL (ref 30.0–36.0)
MCV: 97.8 fL (ref 78.0–100.0)
Platelets: 209 10*3/uL (ref 150–400)
RBC: 4.06 MIL/uL (ref 3.87–5.11)
RDW: 13.1 % (ref 11.5–15.5)
WBC: 11.6 10*3/uL — ABNORMAL HIGH (ref 4.0–10.5)

## 2011-05-10 LAB — BASIC METABOLIC PANEL
BUN: 7 mg/dL (ref 6–23)
CO2: 25 mEq/L (ref 19–32)
Calcium: 9.3 mg/dL (ref 8.4–10.5)
Chloride: 97 mEq/L (ref 96–112)
Creatinine, Ser: 0.57 mg/dL (ref 0.50–1.10)
GFR calc Af Amer: >90 mL/min (ref >90)
GFR calc non Af Amer: >90 mL/min (ref >90)
Glucose, Bld: 129 mg/dL — ABNORMAL HIGH (ref 70–99)
Potassium: 3 mEq/L — ABNORMAL LOW (ref 3.5–5.1)
Sodium: 135 mEq/L (ref 135–145)

## 2011-05-10 LAB — RAPID URINE DRUG SCREEN, HOSP PERFORMED
Amphetamines: NOT DETECTED
Barbiturates: NOT DETECTED
Benzodiazepines: NOT DETECTED
Cocaine: NOT DETECTED
Opiates: NOT DETECTED
Tetrahydrocannabinol: NOT DETECTED

## 2011-05-10 LAB — URINALYSIS, ROUTINE W REFLEX MICROSCOPIC
Bilirubin Urine: NEGATIVE
Glucose, UA: NEGATIVE mg/dL
Hgb urine dipstick: NEGATIVE
Ketones, ur: NEGATIVE mg/dL
Leukocytes, UA: NEGATIVE
Nitrite: NEGATIVE
Protein, ur: NEGATIVE mg/dL
Specific Gravity, Urine: 1.025 (ref 1.005–1.030)
Urobilinogen, UA: 0.2 mg/dL (ref 0.0–1.0)
pH: 5.5 (ref 5.0–8.0)

## 2011-05-10 LAB — DIFFERENTIAL
Basophils Absolute: 0 10*3/uL (ref 0.0–0.1)
Basophils Relative: 0 % (ref 0–1)
Eosinophils Absolute: 0.1 10*3/uL (ref 0.0–0.7)
Eosinophils Relative: 1 % (ref 0–5)
Lymphocytes Relative: 17 % (ref 12–46)
Lymphs Abs: 1.9 10*3/uL (ref 0.7–4.0)
Monocytes Absolute: 0.6 10*3/uL (ref 0.1–1.0)
Monocytes Relative: 5 % (ref 3–12)
Neutro Abs: 8.9 10*3/uL — ABNORMAL HIGH (ref 1.7–7.7)
Neutrophils Relative %: 77 % (ref 43–77)

## 2011-05-10 LAB — PREGNANCY, URINE: Preg Test, Ur: NEGATIVE

## 2011-05-10 LAB — ETHANOL: Alcohol, Ethyl (B): <11 mg/dL (ref 0–11)

## 2011-05-10 MED ORDER — POTASSIUM CHLORIDE CRYS ER 20 MEQ PO TBCR
40.0000 meq | EXTENDED_RELEASE_TABLET | Freq: Once | ORAL | Status: AC
  Administered 2011-05-10: 40 meq via ORAL
  Filled 2011-05-10: qty 2

## 2011-05-10 MED ORDER — CLONAZEPAM 0.5 MG PO TABS: 1.0000 mg | ORAL_TABLET | Freq: Three times a day (TID) | ORAL | Status: DC | PRN

## 2011-05-10 MED ORDER — LORAZEPAM 1 MG PO TABS
1.0000 mg | ORAL_TABLET | Freq: Once | ORAL | Status: AC
  Administered 2011-05-10: 1 mg via ORAL
  Filled 2011-05-10: qty 1

## 2011-05-10 MED ORDER — HYDROXYZINE HCL 25 MG PO TABS: 25.0000 mg | ORAL_TABLET | Freq: Every day | ORAL | Status: DC

## 2011-05-10 MED ORDER — ACETAMINOPHEN 325 MG PO TABS: 650.0000 mg | ORAL_TABLET | ORAL | Status: DC | PRN

## 2011-05-10 MED ORDER — LORAZEPAM 1 MG PO TABS: 1.0000 mg | ORAL_TABLET | Freq: Three times a day (TID) | ORAL | Status: DC | PRN

## 2011-05-10 MED ORDER — ONDANSETRON HCL 4 MG PO TABS: 4.0000 mg | ORAL_TABLET | Freq: Three times a day (TID) | ORAL | Status: DC | PRN

## 2011-05-10 MED ORDER — TRAZODONE HCL 150 MG PO TABS
300.0000 mg | ORAL_TABLET | Freq: Every day | ORAL | Status: DC
  Filled 2011-05-10: qty 2

## 2011-05-10 MED ORDER — RISPERIDONE 3 MG PO TABS
3.0000 mg | ORAL_TABLET | Freq: Every day | ORAL | Status: DC
  Filled 2011-05-10 (×2): qty 1

## 2011-05-10 MED ORDER — ALPRAZOLAM 0.25 MG PO TABS: ORAL_TABLET | ORAL | Status: DC

## 2011-05-10 MED ORDER — SERTRALINE HCL 50 MG PO TABS
100.0000 mg | ORAL_TABLET | Freq: Every day | ORAL | Status: DC
  Filled 2011-05-10 (×2): qty 2

## 2011-05-10 NOTE — BH Assessment (Signed)
Assessment Note   Jennifer Flores is an 45 y.o. female.  She came to the ED with complaints of suicidal ideation and problems with her medications. She has had increased depression through the holidays because of loneliness. She tells [place] that her family did not visit her. She is trying to move to be with family her sister or her mother. She is currently  followed  By [facility], she is seen by Dr Kirtland Bouchard.; Betti Cruz  for her medications and she is seen in group for depression. She was admitted to [facility] approximately 6 years ago for depression. She was recently placed on Risperdal and has not had a good experience with it. She feels that it has increased her depression and accompanying suicidal thoughts. She denies suicidal thoughts at this time. She denies any homicidal thoughts. She is not psychotic. She is alert and cooperative.  Axis I: Bipolar, Depressed Axis II: Deferred Axis III:  Past Medical History  Diagnosis Date  . Bipolar disease, chronic 2000    Dr. Betti Cruz /   . Suicide attempt   . Spider bite 2008    Seen in the ED   . Narcotic addiction   . Alcohol addiction   . Scabies   . Anxiety   . Panic attack   . Palpitations    Axis IV: other psychosocial or environmental problems, problems related to social environment and problems with primary support group Axis V: 41-50 serious symptoms  Past Medical History:  Past Medical History  Diagnosis Date  . Bipolar disease, chronic 2000    Dr. Betti Cruz /   . Suicide attempt   . Spider bite 2008    Seen in the ED   . Narcotic addiction   . Alcohol addiction   . Scabies   . Anxiety   . Panic attack   . Palpitations     Past Surgical History  Procedure Date  . Cesarean section 2004  . Extraction of teeth 2010    Family History:  Family History  Problem Relation Age of Onset  . Heart disease Mother   . Hypertension Mother   . Diabetes Father   . Anxiety disorder Father   . Anxiety disorder Sister     Social History:   reports that she has been smoking Cigarettes.  She has been smoking about 2 packs per day. She does not have any smokeless tobacco history on file. She reports that she drinks alcohol. She reports that she uses illicit drugs.  Additional Social History:    Allergies:  Allergies  Allergen Reactions  . Penicillins     REACTION: rash    Home Medications:  Medications Prior to Admission  Medication Dose Route Frequency Provider Last Rate Last Dose  . acetaminophen (TYLENOL) tablet 650 mg  650 mg Oral Q4H PRN John L Molpus, MD      . clonazePAM (KLONOPIN) tablet 1 mg  1 mg Oral TID PRN Carlisle Beers Molpus, MD      . hydrOXYzine (ATARAX/VISTARIL) tablet 25 mg  25 mg Oral QHS John L Molpus, MD      . LORazepam (ATIVAN) tablet 1 mg  1 mg Oral Q8H PRN John L Molpus, MD      . ondansetron (ZOFRAN) tablet 4 mg  4 mg Oral Q8H PRN John L Molpus, MD      . risperiDONE (RISPERDAL) tablet 3 mg  3 mg Oral Daily John L Molpus, MD      . sertraline (ZOLOFT) tablet  100 mg  100 mg Oral Daily John L Molpus, MD      . traZODone (DESYREL) tablet 300 mg  300 mg Oral QHS Carlisle Beers Molpus, MD       Medications Prior to Admission  Medication Sig Dispense Refill  . ALPRAZolam (XANAX) 0.25 MG tablet Take 1 by mouth each bedtime for insomnia  5 tablet  0  . clonazePAM (KLONOPIN) 0.5 MG tablet Take 1 mg by mouth 3 (three) times daily. Take one half to one tab by mouth as needed for anxiety      . clotrimazole-betamethasone (LOTRISONE) cream Apply topically 2 (two) times daily. Apply twice daily to rash       . hydrOXYzine (ATARAX) 25 MG tablet Take 25 mg by mouth at bedtime.        . sertraline (ZOLOFT) 100 MG tablet Take 100 mg by mouth daily.       . traZODone (DESYREL) 50 MG tablet Take 300 mg by mouth at bedtime.         OB/GYN Status:  Patient's last menstrual period was 11/07/2010.  General Assessment Data Location of Assessment: [facility] ACT Assessment: Yes Living Arrangements: Alone Can pt return to current living  arrangement?: Yes Admission Status: Voluntary Is patient capable of signing voluntary admission?: Yes Transfer from: Acute Hospital Referral Source: MD  Education Status Is patient currently in school?: No  Risk to self Suicidal Ideation: Yes-Currently Present Suicidal Intent: No Is patient at risk for suicide?: No Suicidal Plan?: No Access to Means: No What has been your use of drugs/alcohol within the last 12 months?: none Previous Attempts/Gestures: No How many times?: 0  Other Self Harm Risks: none Triggers for Past Attempts: None known Intentional Self Injurious Behavior: None Family Suicide History: No Recent stressful life event(s): Other (Comment) (patient lonely, no family visited during holidays) Persecutory voices/beliefs?: No Depression: Yes Depression Symptoms: Tearfulness;Loss of interest in usual pleasures;Feeling worthless/self pity;Isolating Substance abuse history and/or treatment for substance abuse?: No Suicide prevention information given to non-admitted patients: Yes  Risk to Others Homicidal Ideation: No Thoughts of Harm to Others: No Current Homicidal Intent: No Current Homicidal Plan: No Access to Homicidal Means: No Identified Victim: no History of harm to others?: No Assessment of Violence: None Noted Violent Behavior Description: none Does patient have access to weapons?: No Criminal Charges Pending?: No Does patient have a court date: No  Psychosis Hallucinations: None noted Delusions: None noted  Mental Status Report Appear/Hygiene: Disheveled Eye Contact: Fair Motor Activity: Unremarkable Speech: Soft;Slow;Logical/coherent Level of Consciousness: Alert Mood: Depressed Affect: Blunted Anxiety Level: Minimal Thought Processes: Coherent;Relevant Judgement: Unimpaired Orientation: Person;Place;Time;Situation Obsessive Compulsive Thoughts/Behaviors: None  Cognitive Functioning Concentration: Decreased Memory: Recent  Intact;Remote Intact IQ: Average Insight: Good Impulse Control: Good Appetite: Fair Weight Loss: 0  Weight Gain: 0  Sleep: No Change Total Hours of Sleep: 5  Vegetative Symptoms: Decreased grooming  Prior Inpatient Therapy Prior Inpatient Therapy: Yes Prior Therapy Dates: 2006 Prior Therapy Facilty/Provider(s): [facility] Reason for Treatment: depression  Prior Outpatient Therapy Prior Outpatient Therapy: Yes Prior Therapy Dates: 2012 Prior Therapy Facilty/Provider(s): [facility] Reason for Treatment: depression; medications            Values / Beliefs Cultural Requests During Hospitalization: None Spiritual Requests During Hospitalization: None        Additional Information 1:1 In Past 12 Months?: No CIRT Risk: No Elopement Risk: No Does patient have medical clearance?: Yes     Disposition: Patient contracts for safety. She  has an appointment with Dr. Darra Lis on 05/12/2011 for follow up with medications. She has an appointment on 05/13/2011 for her depression group. Dr. Leda Min will discharge the patient and will address her anxiety for the short term until she sees Dr. Darra Lis.  Dr. Leda Min agrees with this disposition. Disposition Disposition of Patient: Outpatient treatment;Referred to Patient referred to: Other (Comment) ([facility];Dr. Betti Cruz 05/12/2011)  On Site Evaluation by:   Reviewed with Physician:     Jearld Pies 05/10/2011 8:52 AM

## 2011-05-10 NOTE — ED Provider Notes (Signed)
History     CSN: [id number]  Arrival date & time 05/10/11  1759   First MD Initiated Contact with Patient 05/10/11 1856      Chief Complaint  Patient presents with  . Medical Clearance    (Consider location/radiation/quality/duration/timing/severity/associated sxs/prior treatment) HPI Comments: 45 year old female with a history of panic attacks and severe anxiety who lives by herself in [place]. She states that she sees a psychiatrist Dr. Betti Cruz but currently is noncompliant with her psychiatric medications including respiratory because she does not feel that it helps. She states that she has a severe fear of living by herself which is gradually getting worse, she is paranoid that people will break into her house, but is not suicidal and has no homicidal thoughts. She states that she has tried to kill herself in the past and has had alcohol and narcotic addiction in the past but denies taking these medications at the present. She is prescribed Xanax by the emergency physician from the last visit yesterday but states that she could not wait at the pharmacy to get it filled because of her severe anxiety. Symptoms are severe, persistent, gradually getting worse. She denies associated headache, chest pain, shortness of breath, stomach pain, dysuria, diarrhea, rash  The history is provided by the patient and medical records.    Past Medical History  Diagnosis Date  . Bipolar disease, chronic 2000    Dr. Betti Cruz /   . Suicide attempt   . Spider bite 2008    Seen in the ED   . Narcotic addiction   . Alcohol addiction   . Scabies   . Anxiety   . Panic attack   . Palpitations     Past Surgical History  Procedure Date  . Cesarean section 2004  . Extraction of teeth 2010    Family History  Problem Relation Age of Onset  . Heart disease Mother   . Hypertension Mother   . Diabetes Father   . Anxiety disorder Father   . Anxiety disorder Sister     History  Substance Use Topics    . Smoking status: Current Everyday Smoker -- 2.0 packs/day    Types: Cigarettes  . Smokeless tobacco: Not on file  . Alcohol Use: Yes     patient said she quit approx 2 years, did have an addiction problem --drinking tonight 12/30    OB History    Grav Para Term Preterm Abortions TAB SAB Ect Mult Living                  Review of Systems  All other systems reviewed and are negative.    Allergies  Penicillins  Home Medications   Current Outpatient Rx  Name Route Sig Dispense Refill  . ALPRAZOLAM 0.25 MG PO TABS  Take 1 by mouth each bedtime for insomnia 5 tablet 0  . CLONAZEPAM 1 MG PO TABS Oral Take 1 mg by mouth 3 (three) times daily.      Marland Kitchen RISPERIDONE 3 MG PO TABS Oral Take 3 mg by mouth at bedtime.     . SERTRALINE HCL 100 MG PO TABS Oral Take 200 mg by mouth every morning.     . TRAZODONE HCL 100 MG PO TABS Oral Take 300 mg by mouth at bedtime.      Marland Kitchen CLOTRIMAZOLE-BETAMETHASONE 1-0.05 % EX CREA Topical Apply topically 2 (two) times daily. Apply twice daily to rash     . HYDROXYZINE HCL 25 MG PO TABS Oral Take  25 mg by mouth at bedtime.        BP 99/72  Pulse 97  Temp(Src) 98.4 F (36.9 C) (Oral)  Resp 18  SpO2 98%  LMP 11/07/2010  Physical Exam  Nursing note and vitals reviewed. Constitutional: She appears well-developed and well-nourished. No distress.  HENT:  Head: Normocephalic and atraumatic.  Mouth/Throat: Oropharynx is clear and moist. No oropharyngeal exudate.  Eyes: Conjunctivae and EOM are normal. Pupils are equal, round, and reactive to light. Right eye exhibits no discharge. Left eye exhibits no discharge. No scleral icterus.  Neck: Normal range of motion. Neck supple. No JVD present. No thyromegaly present.  Cardiovascular: Normal rate, regular rhythm, normal heart sounds and intact distal pulses.  Exam reveals no gallop and no friction rub.   No murmur heard. Pulmonary/Chest: Effort normal and breath sounds normal. No respiratory distress. She  has no wheezes. She has no rales.  Abdominal: Soft. Bowel sounds are normal. She exhibits no distension and no mass. There is no tenderness.  Musculoskeletal: Normal range of motion. She exhibits no edema and no tenderness.  Lymphadenopathy:    She has no cervical adenopathy.  Neurological: She is alert. Coordination normal.  Skin: Skin is warm and dry. No rash noted. No erythema.  Psychiatric:       Distraught, tearful, continues to ask over and over, get home, home I do get home. Denies suicidal thoughts and hallucinations, and endorses depression    ED Course  Procedures (including critical care time)  Labs Reviewed  BASIC METABOLIC PANEL - Abnormal; Notable for the following:    Potassium 3.0 (*)    Glucose, Bld 129 (*)    All other components within normal limits  CBC - Abnormal; Notable for the following:    WBC 11.6 (*)    MCH 34.2 (*)    All other components within normal limits  DIFFERENTIAL - Abnormal; Notable for the following:    Neutro Abs 8.9 (*)    All other components within normal limits  ETHANOL  URINALYSIS, ROUTINE W REFLEX MICROSCOPIC  URINE RAPID DRUG SCREEN (HOSP PERFORMED)  PREGNANCY, URINE   No results found.   1. Suicidal ideation       MDM  Severe anxiety, failing to live by herself successfully, will need a benzodiazepine, behavioral health consultation   Natalia Leatherwood with ACT team has assessed and agrees with admission - [facility] faxed request.     Vida Roller, MD 05/11/11 1356

## 2011-05-10 NOTE — ED Notes (Signed)
Pt states she does not want breakfast. States she would like to go home. Tommy, ACT in to assess pt. Sitter remains at bedside.

## 2011-05-10 NOTE — ED Provider Notes (Signed)
The patient was seen by the act team member.  She has passive thoughts of harming yourself.  She is really not actively suicidal.  She has never tried to harm herself in the past.  Shallow, red.  He has account for an appointment in 2 days, to be seen.  We will give her an anxiolytic,  to help her sleep and release her to follow up with her account for her.  Nicholes Stairs, MD 05/10/11 [phone]

## 2011-05-10 NOTE — BH Assessment (Signed)
Assessment Note   Jennifer Flores is an 45 y.o. female. PT PRESENT WITH INCREASE DEPRESSION, ANXIETY, SUICIDAL THOUGHTS & HAS EXPRESSED SHE HAS NO PLAN BUT MIGHT DO SOMETHING ONCE SHE GOT HOME. PT WAS SEEN IN THE LAST 10 HOURS FOR SAME SYMPTOMS BUT WAS TO ANXIETY TO FOLLOW UP WITH PROVIDER. PT STATES NOTHING HAS CHANGED & SHE FEELS WORSE THAN SHE FELT YESTERDAY. PT WAS SO ANXIOUS & COULD NOT WAIT FOR HER MEDS TO BE FILLED. PT CONTINUOUSLY ASKS HOW SHE WILL GET HOME & IS WORRIED ABOUT MANY THINGS. PT SAYS SHE HAS NOT SUPPORT & THE HOLIDAYS TEND TO MAKE HER VERY DEPRESSED. PT IS NOT ABLE TO CONTRACT FOR SAFETY AT THIS TIME. PT EXPRESSED THAT MEDS ARE NOT WORKING & HAS NOT BEEN COMPLAINT WITH IT. PT IS TEARFUL, EMOTIONAL & SPIRALING DOWN. EDP WAS PT ADMITTED FOR STABILIZATION SINCE SHE HAS RETURNED FOR SAME SYMPTOMS IN THE LAST 10 HRS & CANT CONTRACT. PT DENIES HI OR AV. PT HAS A PAST HX OF SUBSTANCE ABUSE BUT DENIES CURRENT USE. PT HAS BEEN REFERRED TO [facility] & IS PENDING DISPOSITION.  Axis I: Anxiety Disorder NOS and Depressive Disorder NOS Axis II: Deferred Axis III:  Past Medical History  Diagnosis Date  . Bipolar disease, chronic 2000    Dr. Betti Cruz /   . Suicide attempt   . Spider bite 2008    Seen in the ED   . Narcotic addiction   . Alcohol addiction   . Scabies   . Anxiety   . Panic attack   . Palpitations    Axis IV: problems with primary support group Axis V: 11-20 some danger of hurting self or others possible OR occasionally fails to maintain minimal personal hygiene OR gross impairment in communication  Past Medical History:  Past Medical History  Diagnosis Date  . Bipolar disease, chronic 2000    Dr. Betti Cruz /   . Suicide attempt   . Spider bite 2008    Seen in the ED   . Narcotic addiction   . Alcohol addiction   . Scabies   . Anxiety   . Panic attack   . Palpitations     Past Surgical History  Procedure Date  . Cesarean section 2004  . Extraction of  teeth 2010    Family History:  Family History  Problem Relation Age of Onset  . Heart disease Mother   . Hypertension Mother   . Diabetes Father   . Anxiety disorder Father   . Anxiety disorder Sister     Social History:  reports that she has been smoking Cigarettes.  She has been smoking about 2 packs per day. She does not have any smokeless tobacco history on file. She reports that she drinks alcohol. She reports that she uses illicit drugs.  Additional Social History:    Allergies:  Allergies  Allergen Reactions  . Penicillins     REACTION: rash    Home Medications:  Medications Prior to Admission  Medication Dose Route Frequency Provider Last Rate Last Dose  . LORazepam (ATIVAN) tablet 1 mg  1 mg Oral Once Vida Roller, MD   1 mg at 05/10/11 2007  . DISCONTD: acetaminophen (TYLENOL) tablet 650 mg  650 mg Oral Q4H PRN Carlisle Beers Molpus, MD      . DISCONTD: clonazePAM (KLONOPIN) tablet 1 mg  1 mg Oral TID PRN Hanley Seamen, MD      . DISCONTD: hydrOXYzine (ATARAX/VISTARIL) tablet 25 mg  25 mg Oral QHS John L Molpus, MD      . DISCONTD: LORazepam (ATIVAN) tablet 1 mg  1 mg Oral Q8H PRN John L Molpus, MD      . DISCONTD: ondansetron (ZOFRAN) tablet 4 mg  4 mg Oral Q8H PRN John L Molpus, MD      . DISCONTD: risperiDONE (RISPERDAL) tablet 3 mg  3 mg Oral Daily John L Molpus, MD      . DISCONTD: sertraline (ZOLOFT) tablet 100 mg  100 mg Oral Daily John L Molpus, MD      . DISCONTD: traZODone (DESYREL) tablet 300 mg  300 mg Oral QHS John L Molpus, MD       Medications Prior to Admission  Medication Sig Dispense Refill  . ALPRAZolam (XANAX) 0.25 MG tablet Take 1 by mouth each bedtime for insomnia  5 tablet  0  . risperiDONE (RISPERDAL) 3 MG tablet Take 3 mg by mouth at bedtime.       . sertraline (ZOLOFT) 100 MG tablet Take 200 mg by mouth every morning.       . clotrimazole-betamethasone (LOTRISONE) cream Apply topically 2 (two) times daily. Apply twice daily to rash       .  hydrOXYzine (ATARAX) 25 MG tablet Take 25 mg by mouth at bedtime.          OB/GYN Status:  Patient's last menstrual period was 11/07/2010.  General Assessment Data Location of Assessment: [facility] ACT Assessment: Yes Living Arrangements: Alone Can pt return to current living arrangement?: Yes Admission Status: Voluntary Is patient capable of signing voluntary admission?: Yes Transfer from: Acute Hospital Referral Source: MD     Risk to self Suicidal Ideation: Yes-Currently Present Suicidal Intent: Yes-Currently Present Is patient at risk for suicide?: Yes Suicidal Plan?: No Access to Means: No What has been your use of drugs/alcohol within the last 12 months?: NA Previous Attempts/Gestures: No How many times?: 0  Other Self Harm Risks: NA Triggers for Past Attempts: None known Intentional Self Injurious Behavior: None Family Suicide History: No Recent stressful life event(s): Other (Comment) (LIMITED SUPPORT; INCREASE ANXIETY, LIVES ALONE) Persecutory voices/beliefs?: No Depression: Yes Depression Symptoms: Tearfulness;Loss of interest in usual pleasures;Feeling worthless/self pity;Isolating Substance abuse history and/or treatment for substance abuse?: No Suicide prevention information given to non-admitted patients: Not applicable  Risk to Others Homicidal Ideation: No Thoughts of Harm to Others: No Current Homicidal Intent: No Current Homicidal Plan: No Access to Homicidal Means: No Identified Victim: NA History of harm to others?: No Assessment of Violence: None Noted Violent Behavior Description: NA Does patient have access to weapons?: No Criminal Charges Pending?: No Does patient have a court date: No  Psychosis Hallucinations: None noted Delusions: None noted  Mental Status Report Appear/Hygiene: Disheveled Eye Contact: Fair Motor Activity: Unremarkable Speech: Soft;Slow;Logical/coherent Level of Consciousness: Alert Mood:  Depressed;Anhedonia;Sad;Fearful;Worthless, low self-esteem Affect: Blunted Anxiety Level: Minimal Thought Processes: Coherent;Relevant Judgement: Unimpaired Orientation: Person;Place;Time;Situation Obsessive Compulsive Thoughts/Behaviors: None  Cognitive Functioning Concentration: Decreased Memory: Recent Intact;Remote Intact IQ: Average Insight: Good Impulse Control: Fair Appetite: Fair Weight Loss: 0  Weight Gain: 0  Sleep: Decreased Total Hours of Sleep: 5  Vegetative Symptoms: None  Prior Inpatient Therapy Prior Inpatient Therapy: Yes Prior Therapy Dates: 2006 Prior Therapy Facilty/Provider(s): [facility] Reason for Treatment: STABILIZATION  Prior Outpatient Therapy Prior Outpatient Therapy: Yes Prior Therapy Dates: CURRENT Prior Therapy Facilty/Provider(s): [facility] Reason for Treatment: MED MANAGEMENT            Values / Beliefs  Cultural Requests During Hospitalization: None Spiritual Requests During Hospitalization: None        Additional Information 1:1 In Past 12 Months?: No CIRT Risk: No Elopement Risk: No Does patient have medical clearance?: Yes     Disposition:  Disposition Disposition of Patient: Inpatient treatment program;Referred to ([facility]) Type of inpatient treatment program: Adult  On Site Evaluation by:   Reviewed with Physician:     Waldron Session 05/10/2011 8:31 PM

## 2011-05-10 NOTE — ED Notes (Signed)
Feels suicidal

## 2011-05-10 NOTE — ED Notes (Signed)
Alert, talking,  Immediately asks "HOw am I going to get home". Says she has suicidal thoughts.  Was seen here yesterday also for similar sx.  Wanded ,  Labs drawn,  Placed in scrubs. And sitter at bedside.

## 2011-05-10 NOTE — ED Notes (Signed)
Pt sleeping.   No distress noted.

## 2011-05-10 NOTE — ED Notes (Signed)
Pt has had ACT eval awaiting  Call from [facility].

## 2011-05-10 NOTE — ED Provider Notes (Signed)
History     CSN: [id number]  Arrival date & time 05/09/11  1941   First MD Initiated Contact with Patient 05/10/11 0001      Chief Complaint  Patient presents with  . Medical Clearance    (Consider location/radiation/quality/duration/timing/severity/associated sxs/prior treatment) HPI This is a 45 year old white female with a one-month history of severe depression. She states this began when she was placed on Risperdal by her psychiatrist.she states all she does all day as soon on the couch and watch television. She states she doesn't have the energy to get out of bed and perform activities of daily living. She denies alcohol or drug abuse. She has intermittent suicidal ideation although she denies it at the present time. She has frequent crying episodes. She attributes to her depression to feelings of loneliness because she has no family or friends. She states she has had some mild shortness of breath and mild diarrhea since being placed on the Risperdal but denies chest pain, abdominal pain, nausea or vomiting. She thinks she may have had a low-grade fever several days ago. She states her medications are not working and she believes they need to be changed.  Past Medical History  Diagnosis Date  . Bipolar disease, chronic 2000    Dr. Betti Cruz /   . Suicide attempt   . Spider bite 2008    Seen in the ED   . Narcotic addiction   . Alcohol addiction   . Scabies   . Anxiety   . Panic attack   . Palpitations     Past Surgical History  Procedure Date  . Cesarean section 2004  . Extraction of teeth 2010    Family History  Problem Relation Age of Onset  . Heart disease Mother   . Hypertension Mother   . Diabetes Father   . Anxiety disorder Father   . Anxiety disorder Sister     History  Substance Use Topics  . Smoking status: Current Everyday Smoker -- 2.0 packs/day    Types: Cigarettes  . Smokeless tobacco: Not on file  . Alcohol Use: Yes     patient said she quit  approx 2 years, did have an addiction problem --drinking tonight 12/30    OB History    Grav Para Term Preterm Abortions TAB SAB Ect Mult Living                  Review of Systems  All other systems reviewed and are negative.    Allergies  Penicillins  Home Medications   Current Outpatient Rx  Name Route Sig Dispense Refill  . RISPERIDONE 3 MG PO TABS Oral Take 3 mg by mouth daily.      Marland Kitchen CLONAZEPAM 0.5 MG PO TABS Oral Take 1 mg by mouth 3 (three) times daily. Take one half to one tab by mouth as needed for anxiety    . CLOTRIMAZOLE-BETAMETHASONE 1-0.05 % EX CREA Topical Apply topically 2 (two) times daily. Apply twice daily to rash     . HYDROXYZINE HCL 25 MG PO TABS Oral Take 25 mg by mouth at bedtime.      . SERTRALINE HCL 100 MG PO TABS Oral Take 100 mg by mouth daily.     . TRAZODONE HCL 50 MG PO TABS Oral Take 300 mg by mouth at bedtime.       BP 131/76  Pulse 92  Temp(Src) 98.2 F (36.8 C) (Oral)  Resp 18  Ht 5\' 2"  (1.575 m)  Wt  100 lb (45.36 kg)  BMI 18.29 kg/m2  SpO2 100%  LMP 11/07/2010  Physical Exam General: Well-developed, well-nourished female in no acute distress; appearance consistent with age of record HENT: normocephalic, atraumatic Eyes: pupils equal round and reactive to light; extraocular muscles intact Neck: supple Heart: regular rate and rhythm Lungs: clear to auscultation bilaterally Abdomen: soft; nontender; nondistended; bowel sounds present Extremities: No deformity; full range of motion Neurologic: Awake, alert and oriented; motor function intact in all extremities and symmetric; no facial droop Skin: Warm and dry Psychiatric: depressed mood with congruent affect    ED Course  Procedures (including critical care time)    MDM   Nursing notes and vitals signs, including pulse oximetry, reviewed.  Summary of this visit's results, reviewed by myself:  Labs:  Results for orders placed during the hospital encounter of 05/09/11    URINALYSIS, ROUTINE W REFLEX MICROSCOPIC      Component Value Range   Color, Urine YELLOW  YELLOW    APPearance CLEAR  CLEAR    Specific Gravity, Urine 1.010  1.005 - 1.030    pH 6.0  5.0 - 8.0    Glucose, UA NEGATIVE  NEGATIVE (mg/dL)   Hgb urine dipstick TRACE (*) NEGATIVE    Bilirubin Urine NEGATIVE  NEGATIVE    Ketones, ur NEGATIVE  NEGATIVE (mg/dL)   Protein, ur NEGATIVE  NEGATIVE (mg/dL)   Urobilinogen, UA 0.2  0.0 - 1.0 (mg/dL)   Nitrite NEGATIVE  NEGATIVE    Leukocytes, UA NEGATIVE  NEGATIVE   PREGNANCY, URINE      Component Value Range   Preg Test, Ur NEGATIVE    URINE RAPID DRUG SCREEN (HOSP PERFORMED)      Component Value Range   Opiates NONE DETECTED  NONE DETECTED    Cocaine NONE DETECTED  NONE DETECTED    Benzodiazepines NONE DETECTED  NONE DETECTED    Amphetamines NONE DETECTED  NONE DETECTED    Tetrahydrocannabinol NONE DETECTED  NONE DETECTED    Barbiturates NONE DETECTED  NONE DETECTED   BASIC METABOLIC PANEL      Component Value Range   Sodium 136  135 - 145 (mEq/L)   Potassium 3.4 (*) 3.5 - 5.1 (mEq/L)   Chloride 100  96 - 112 (mEq/L)   CO2 27  19 - 32 (mEq/L)   Glucose, Bld 105 (*) 70 - 99 (mg/dL)   BUN 8  6 - 23 (mg/dL)   Creatinine, Ser 0.98  0.50 - 1.10 (mg/dL)   Calcium 9.2  8.4 - 11.9 (mg/dL)   GFR calc non Af Amer >90  >90 (mL/min)   GFR calc Af Amer >90  >90 (mL/min)  CBC      Component Value Range   WBC 20.3 (*) 4.0 - 10.5 (K/uL)   RBC 3.81 (*) 3.87 - 5.11 (MIL/uL)   Hemoglobin 12.8  12.0 - 15.0 (g/dL)   HCT 14.7  82.9 - 56.2 (%)   MCV 100.0  78.0 - 100.0 (fL)   MCH 33.6  26.0 - 34.0 (pg)   MCHC 33.6  30.0 - 36.0 (g/dL)   RDW 13.0  86.5 - 78.4 (%)   Platelets 211  150 - 400 (K/uL)  DIFFERENTIAL      Component Value Range   Neutrophils Relative 82 (*) 43 - 77 (%)   Neutro Abs 16.7 (*) 1.7 - 7.7 (K/uL)   Lymphocytes Relative 11 (*) 12 - 46 (%)   Lymphs Abs 2.3  0.7 - 4.0 (K/uL)  Monocytes Relative 5  3 - 12 (%)   Monocytes Absolute  1.0  0.1 - 1.0 (K/uL)   Eosinophils Relative 1  0 - 5 (%)   Eosinophils Absolute 0.3  0.0 - 0.7 (K/uL)   Basophils Relative 0  0 - 1 (%)   Basophils Absolute 0.1  0.0 - 0.1 (K/uL)  ETHANOL      Component Value Range   Alcohol, Ethyl (B) <11  0 - 11 (mg/dL)  URINE MICROSCOPIC-ADD ON      Component Value Range   Squamous Epithelial / LPF FEW (*) RARE    WBC, UA 3-6  <3 (WBC/hpf)   RBC / HPF 3-6  <3 (RBC/hpf)   Bacteria, UA RARE  RARE     [facility] to Evaluate. Holding orders written.       Hanley Seamen, MD 05/10/11 [phone]

## 2011-05-10 NOTE — ED Notes (Signed)
Pt sleeping. Breakfast tray to bedside. Sitter at bedside.

## 2011-05-10 NOTE — ED Notes (Signed)
[place] [employer] notified that patient needs a ride home and will send an Technical sales engineer. Pt denies suicidal or homicidal thoughts.

## 2011-05-11 MED ORDER — NICOTINE 21 MG/24HR TD PT24
MEDICATED_PATCH | TRANSDERMAL | Status: AC
  Administered 2011-05-11: 21 mg
  Filled 2011-05-11: qty 1

## 2011-05-11 NOTE — ED Notes (Signed)
[facility] called to say they will send transport for Pt ASAP.

## 2011-05-11 NOTE — ED Notes (Signed)
Unable to find pt transportation to [facility]. Jennifer Flores, with [facility] will come in to reeval

## 2011-05-11 NOTE — ED Notes (Signed)
Attempting to get [facility] to transport pt.

## 2011-05-11 NOTE — Progress Notes (Signed)
The patient remains in the ED overnight. She was seen earlier 05/10/11 and contracted for safety and was discharged. She returned later that same day with increased suicidal thoughts. She could not contract for safety at that time. This morning she remains suicidal . She has no plan at this time but remains afraid to return home for fear of what she might do.  She has been accepted by Darra Lis  MD, to [facility]. There was some confusion about transport as [facility] did not have trucks avaible today. Before other transportation could be arranged , [facility]  called and stated they would be able to transport the patient shortly.. Dr  Judd Lien is in agreement with the disposition.

## 2011-05-11 NOTE — ED Notes (Signed)
Called [facility] for transport to [facility].  Informed by Staff that they could not take that Pt today.  CN informed.

## 2011-05-11 NOTE — ED Notes (Signed)
Pt up to bathroom. Declined breakfast. Waiting for [facility] to transport

## 2011-05-11 NOTE — ED Notes (Signed)
Instructed by CN to call [facility] to see if they could transport Pt.  They said they would check with the Supervisor and call back.

## 2011-05-11 NOTE — ED Notes (Signed)
[facility] unable to transport pt. Shift manager states they had a call out and they only have one truck.

## 2011-05-13 ENCOUNTER — Encounter: Payer: Self-pay | Admitting: Family Medicine

## 2011-05-14 ENCOUNTER — Ambulatory Visit: Payer: Medicare Other | Admitting: Family Medicine

## 2011-05-24 ENCOUNTER — Ambulatory Visit: Payer: Medicare Other | Admitting: Family Medicine

## 2011-10-31 ENCOUNTER — Emergency Department (HOSPITAL_COMMUNITY): Payer: Medicare Other

## 2011-10-31 ENCOUNTER — Encounter (HOSPITAL_COMMUNITY): Payer: Self-pay | Admitting: Emergency Medicine

## 2011-10-31 ENCOUNTER — Emergency Department (HOSPITAL_COMMUNITY)
Admission: EM | Admit: 2011-10-31 | Discharge: 2011-11-01 | Disposition: A | Discharge status: Home / Self Care | Payer: Medicare Other | Attending: Emergency Medicine | Admitting: Emergency Medicine

## 2011-10-31 DIAGNOSIS — R202 Paresthesia of skin: Secondary | ICD-10-CM

## 2011-10-31 DIAGNOSIS — M542 Cervicalgia: Secondary | ICD-10-CM | POA: Insufficient documentation

## 2011-10-31 DIAGNOSIS — R209 Unspecified disturbances of skin sensation: Secondary | ICD-10-CM | POA: Insufficient documentation

## 2011-10-31 DIAGNOSIS — F341 Dysthymic disorder: Secondary | ICD-10-CM | POA: Insufficient documentation

## 2011-10-31 DIAGNOSIS — F17200 Nicotine dependence, unspecified, uncomplicated: Secondary | ICD-10-CM | POA: Insufficient documentation

## 2011-10-31 DIAGNOSIS — J069 Acute upper respiratory infection, unspecified: Secondary | ICD-10-CM | POA: Insufficient documentation

## 2011-10-31 DIAGNOSIS — R109 Unspecified abdominal pain: Secondary | ICD-10-CM | POA: Insufficient documentation

## 2011-10-31 DIAGNOSIS — R1110 Vomiting, unspecified: Secondary | ICD-10-CM | POA: Insufficient documentation

## 2011-10-31 DIAGNOSIS — Z79899 Other long term (current) drug therapy: Secondary | ICD-10-CM | POA: Insufficient documentation

## 2011-10-31 DIAGNOSIS — F319 Bipolar disorder, unspecified: Secondary | ICD-10-CM | POA: Insufficient documentation

## 2011-10-31 DIAGNOSIS — R197 Diarrhea, unspecified: Secondary | ICD-10-CM

## 2011-10-31 HISTORY — DX: Depression, unspecified: F32.A

## 2011-10-31 HISTORY — DX: Major depressive disorder, single episode, unspecified: F32.9

## 2011-10-31 LAB — CBC
HCT: 42.9 % (ref 36.0–46.0)
Hemoglobin: 15 g/dL (ref 12.0–15.0)
MCH: 33.6 pg (ref 26.0–34.0)
MCHC: 35 g/dL (ref 30.0–36.0)
MCV: 96.2 fL (ref 78.0–100.0)
Platelets: 200 10*3/uL (ref 150–400)
RBC: 4.46 MIL/uL (ref 3.87–5.11)
RDW: 12.7 % (ref 11.5–15.5)
WBC: 15.6 10*3/uL — ABNORMAL HIGH (ref 4.0–10.5)

## 2011-10-31 LAB — URINALYSIS, ROUTINE W REFLEX MICROSCOPIC
Bilirubin Urine: NEGATIVE
Glucose, UA: NEGATIVE mg/dL
Hgb urine dipstick: NEGATIVE
Ketones, ur: NEGATIVE mg/dL
Leukocytes, UA: NEGATIVE
Nitrite: NEGATIVE
Protein, ur: NEGATIVE mg/dL
Specific Gravity, Urine: <1.005 — ABNORMAL LOW (ref 1.005–1.030)
Urobilinogen, UA: 0.2 mg/dL (ref 0.0–1.0)
pH: 6 (ref 5.0–8.0)

## 2011-10-31 LAB — COMPREHENSIVE METABOLIC PANEL
ALT: 10 U/L (ref 0–35)
AST: 16 U/L (ref 0–37)
Albumin: 4.4 g/dL (ref 3.5–5.2)
Alkaline Phosphatase: 86 U/L (ref 39–117)
BUN: 5 mg/dL — ABNORMAL LOW (ref 6–23)
CO2: 26 mEq/L (ref 19–32)
Calcium: 9.1 mg/dL (ref 8.4–10.5)
Chloride: 99 mEq/L (ref 96–112)
Creatinine, Ser: 0.67 mg/dL (ref 0.50–1.10)
GFR calc Af Amer: >90 mL/min (ref >90)
GFR calc non Af Amer: >90 mL/min (ref >90)
Glucose, Bld: 69 mg/dL — ABNORMAL LOW (ref 70–99)
Potassium: 3.2 mEq/L — ABNORMAL LOW (ref 3.5–5.1)
Sodium: 136 mEq/L (ref 135–145)
Total Bilirubin: 0.2 mg/dL — ABNORMAL LOW (ref 0.3–1.2)
Total Protein: 7.8 g/dL (ref 6.0–8.3)

## 2011-10-31 LAB — DIFFERENTIAL
Basophils Absolute: 0 10*3/uL (ref 0.0–0.1)
Basophils Relative: 0 % (ref 0–1)
Eosinophils Absolute: 0.2 10*3/uL (ref 0.0–0.7)
Eosinophils Relative: 1 % (ref 0–5)
Lymphocytes Relative: 16 % (ref 12–46)
Lymphs Abs: 2.5 10*3/uL (ref 0.7–4.0)
Monocytes Absolute: 0.8 10*3/uL (ref 0.1–1.0)
Monocytes Relative: 5 % (ref 3–12)
Neutro Abs: 12.1 10*3/uL — ABNORMAL HIGH (ref 1.7–7.7)
Neutrophils Relative %: 77 % (ref 43–77)

## 2011-10-31 LAB — TROPONIN I: Troponin I: <0.3 ng/mL (ref <0.30)

## 2011-10-31 LAB — LIPASE, BLOOD: Lipase: 21 U/L (ref 11–59)

## 2011-10-31 LAB — PREGNANCY, URINE: Preg Test, Ur: NEGATIVE

## 2011-10-31 MED ORDER — IOHEXOL 300 MG/ML  SOLN: 40.0000 mL | Freq: Once | INTRAMUSCULAR | Status: AC | PRN

## 2011-10-31 MED ORDER — IOHEXOL 300 MG/ML  SOLN
100.0000 mL | Freq: Once | INTRAMUSCULAR | Status: AC | PRN
  Administered 2011-10-31: 100 mL via INTRAVENOUS

## 2011-10-31 MED ORDER — SODIUM CHLORIDE 0.9 % IV BOLUS (SEPSIS)
1000.0000 mL | Freq: Once | INTRAVENOUS | Status: AC
  Administered 2011-10-31: 1000 mL via INTRAVENOUS

## 2011-10-31 MED ORDER — ONDANSETRON HCL 4 MG/2ML IJ SOLN
4.0000 mg | Freq: Once | INTRAMUSCULAR | Status: AC
  Administered 2011-10-31: 4 mg via INTRAVENOUS
  Filled 2011-10-31: qty 2

## 2011-10-31 MED ORDER — HYDROMORPHONE HCL PF 1 MG/ML IJ SOLN
1.0000 mg | Freq: Once | INTRAMUSCULAR | Status: AC
  Administered 2011-10-31: 1 mg via INTRAVENOUS
  Filled 2011-10-31: qty 1

## 2011-10-31 MED ORDER — METOCLOPRAMIDE HCL 5 MG/ML IJ SOLN
10.0000 mg | Freq: Once | INTRAMUSCULAR | Status: AC
  Administered 2011-10-31: 10 mg via INTRAVENOUS
  Filled 2011-10-31: qty 2

## 2011-10-31 MED ORDER — ONDANSETRON 8 MG PO TBDP: ORAL_TABLET | ORAL | Status: AC

## 2011-10-31 MED ORDER — SODIUM CHLORIDE 0.9 % IV SOLN: INTRAVENOUS | Status: DC

## 2011-10-31 MED ORDER — HYDROMORPHONE HCL PF 1 MG/ML IJ SOLN
0.5000 mg | Freq: Once | INTRAMUSCULAR | Status: AC
  Administered 2011-10-31: 0.5 mg via INTRAVENOUS
  Filled 2011-10-31: qty 1

## 2011-10-31 NOTE — ED Notes (Signed)
States "I am vomiting and it started 2 days ago."  States " I feel numb and dizzy right now."

## 2011-10-31 NOTE — ED Notes (Signed)
Troponin added in lab by lab staff to prevent sticking the patient again.

## 2011-10-31 NOTE — ED Provider Notes (Signed)
History   Scribed for No att. providers found, the patient was seen in APA15/APA15. The chart was scribed by Gilman Schmidt. The patients care was started at 1:41 AM.   CSN: [id number]  Arrival date & time 10/31/11  1956   First MD Initiated Contact with Patient 10/31/11 2024      Chief Complaint  Patient presents with  . Emesis  . Dizziness  . Abdominal Pain    (Consider location/radiation/quality/duration/timing/severity/associated sxs/prior treatment) HPI Jennifer Flores is a 46 y.o. female who presents to the Emergency Department complaining of emesis, dizziness, and constant right sided abdominal pain onset two days constant. Pt also reports neck pain radiating down to right arm (with tingling intermittent several minutes at a time several times a day x 2 days), nasal congestion, cough, and diarrhea. No weakness/numbness/incoordination to right arm. There are no other associated symptoms and no other alleviating or aggravating factors.    Past Medical History  Diagnosis Date  . Bipolar disease, chronic 2000    Dr. Betti Cruz /   . Suicide attempt   . Spider bite 2008    Seen in the ED   . Narcotic addiction   . Alcohol addiction   . Scabies   . Anxiety   . Panic attack   . Palpitations   . Depression     Past Surgical History  Procedure Date  . Cesarean section 2004  . Extraction of teeth 2010    Family History  Problem Relation Age of Onset  . Heart disease Mother   . Hypertension Mother   . Diabetes Father   . Anxiety disorder Father   . Anxiety disorder Sister     History  Substance Use Topics  . Smoking status: Current Everyday Smoker -- 2.0 packs/day    Types: Cigarettes  . Smokeless tobacco: Not on file  . Alcohol Use: No     patient said she quit approx 2 years, did have an addiction problem --drinking tonight 12/30    OB History    Grav Para Term Preterm Abortions TAB SAB Ect Mult Living                  Review of Systems  Constitutional:  Negative for fever.       10 Systems reviewed and are negative for acute change except as noted in the HPI.  HENT: Positive for congestion. Negative for rhinorrhea.   Eyes: Negative for discharge and redness.  Respiratory: Positive for cough. Negative for shortness of breath.   Cardiovascular: Negative for chest pain.  Gastrointestinal: Positive for nausea, vomiting, abdominal pain and diarrhea.  Genitourinary: Negative for dysuria.  Musculoskeletal: Negative for back pain.       Arm pain   Skin: Negative for rash.  Neurological: Positive for dizziness. Negative for syncope, speech difficulty, weakness, numbness and headaches.  Psychiatric/Behavioral: Negative for suicidal ideas, hallucinations and confusion.    Allergies  Penicillins  Home Medications   Current Outpatient Rx  Name Route Sig Dispense Refill  . ALPRAZOLAM 0.25 MG PO TABS  Take 1 by mouth each bedtime for insomnia 5 tablet 0  . CLONAZEPAM 1 MG PO TABS Oral Take 1 mg by mouth 3 (three) times daily.      Marland Kitchen HYDROXYZINE HCL 25 MG PO TABS Oral Take 25 mg by mouth at bedtime.      Marland Kitchen RISPERIDONE 3 MG PO TABS Oral Take 3 mg by mouth at bedtime.     . SERTRALINE HCL 100  MG PO TABS Oral Take 200 mg by mouth every morning.     . TRAZODONE HCL 100 MG PO TABS Oral Take 300 mg by mouth at bedtime.      Marland Kitchen ONDANSETRON 8 MG PO TBDP  8mg  ODT q4 hours prn nausea 2 tablet 0    BP 113/76  Pulse 81  Temp 98.8 F (37.1 C) (Oral)  Resp 18  Ht 5' (1.524 m)  Wt 100 lb (45.36 kg)  BMI 19.53 kg/m2  SpO2 95%  LMP 10/17/2011  Physical Exam  Nursing note and vitals reviewed. Constitutional:       Awake, alert, nontoxic appearance with baseline speech for patient.  HENT:  Head: Atraumatic.  Mouth/Throat: No oropharyngeal exudate.  Eyes: EOM are normal. Pupils are equal, round, and reactive to light. Right eye exhibits no discharge. Left eye exhibits no discharge.  Neck: Neck supple.  Cardiovascular: Normal rate and regular rhythm.     No murmur heard. Pulmonary/Chest: Effort normal and breath sounds normal. No stridor. No respiratory distress. She has no wheezes. She has no rales. She exhibits no tenderness.       Chest wall diffusely tender  Abdominal: Soft. Bowel sounds are normal. She exhibits no mass. There is tenderness (moderate ) in the right upper quadrant, right lower quadrant and epigastric area. There is no rebound.  Musculoskeletal: She exhibits no tenderness.       Baseline ROM, moves extremities with no obvious new focal weakness.  Lymphadenopathy:    She has no cervical adenopathy.  Neurological:       Awake, alert, cooperative and aware of situation; motor strength bilaterally; sensation normal to light touch bilaterally; peripheral visual fields full to confrontation; no facial asymmetry; tongue midline; major cranial nerves appear intact; no pronator drift, normal finger to nose bilaterally, baseline gait without new ataxia.  Skin: No rash noted.  Psychiatric: She has a normal mood and affect.    ED Course  Procedures (including critical care time) ECG: Normal sinus rhythm, ventricular rate 92, normal axis, normal intervals, no acute ischemic changes noted, impression normal ECG, compared with May 2012 inverted T waves no longer present in inferior leads Labs Reviewed  CBC - Abnormal; Notable for the following:    WBC 15.6 (*)     All other components within normal limits  DIFFERENTIAL - Abnormal; Notable for the following:    Neutro Abs 12.1 (*)     All other components within normal limits  COMPREHENSIVE METABOLIC PANEL - Abnormal; Notable for the following:    Potassium 3.2 (*)     Glucose, Bld 69 (*)     BUN 5 (*)     Total Bilirubin 0.2 (*)     All other components within normal limits  URINALYSIS, ROUTINE W REFLEX MICROSCOPIC - Abnormal; Notable for the following:    Color, Urine STRAW (*)     Specific Gravity, Urine <1.005 (*)     All other components within normal limits  LIPASE, BLOOD   PREGNANCY, URINE  TROPONIN I  LAB REPORT - SCANNED   No results found.   1. Abdominal pain   2. Vomiting and diarrhea   3. Paresthesias   4. Neck pain   5. Upper respiratory infection     DIAGNOSTIC STUDIES: Oxygen Saturation is 100% on room air, normal by my interpretation.    LABS Results for orders placed during the hospital encounter of 10/31/11  CBC      Component Value Range  WBC 15.6 (*) 4.0 - 10.5 K/uL   RBC 4.46  3.87 - 5.11 MIL/uL   Hemoglobin 15.0  12.0 - 15.0 g/dL   HCT 16.1  09.6 - 04.5 %   MCV 96.2  78.0 - 100.0 fL   MCH 33.6  26.0 - 34.0 pg   MCHC 35.0  30.0 - 36.0 g/dL   RDW 40.9  81.1 - 91.4 %   Platelets 200  150 - 400 K/uL  DIFFERENTIAL      Component Value Range   Neutrophils Relative 77  43 - 77 %   Neutro Abs 12.1 (*) 1.7 - 7.7 K/uL   Lymphocytes Relative 16  12 - 46 %   Lymphs Abs 2.5  0.7 - 4.0 K/uL   Monocytes Relative 5  3 - 12 %   Monocytes Absolute 0.8  0.1 - 1.0 K/uL   Eosinophils Relative 1  0 - 5 %   Eosinophils Absolute 0.2  0.0 - 0.7 K/uL   Basophils Relative 0  0 - 1 %   Basophils Absolute 0.0  0.0 - 0.1 K/uL  COMPREHENSIVE METABOLIC PANEL      Component Value Range   Sodium 136  135 - 145 mEq/L   Potassium 3.2 (*) 3.5 - 5.1 mEq/L   Chloride 99  96 - 112 mEq/L   CO2 26  19 - 32 mEq/L   Glucose, Bld 69 (*) 70 - 99 mg/dL   BUN 5 (*) 6 - 23 mg/dL   Creatinine, Ser 7.82  0.50 - 1.10 mg/dL   Calcium 9.1  8.4 - 95.6 mg/dL   Total Protein 7.8  6.0 - 8.3 g/dL   Albumin 4.4  3.5 - 5.2 g/dL   AST 16  0 - 37 U/L   ALT 10  0 - 35 U/L   Alkaline Phosphatase 86  39 - 117 U/L   Total Bilirubin 0.2 (*) 0.3 - 1.2 mg/dL   GFR calc non Af Amer >90  >90 mL/min   GFR calc Af Amer >90  >90 mL/min  LIPASE, BLOOD      Component Value Range   Lipase 21  11 - 59 U/L  URINALYSIS, ROUTINE W REFLEX MICROSCOPIC      Component Value Range   Color, Urine STRAW (*) YELLOW   APPearance CLEAR  CLEAR   Specific Gravity, Urine <1.005 (*) 1.005 -  1.030   pH 6.0  5.0 - 8.0   Glucose, UA NEGATIVE  NEGATIVE mg/dL   Hgb urine dipstick NEGATIVE  NEGATIVE   Bilirubin Urine NEGATIVE  NEGATIVE   Ketones, ur NEGATIVE  NEGATIVE mg/dL   Protein, ur NEGATIVE  NEGATIVE mg/dL   Urobilinogen, UA 0.2  0.0 - 1.0 mg/dL   Nitrite NEGATIVE  NEGATIVE   Leukocytes, UA NEGATIVE  NEGATIVE  PREGNANCY, URINE      Component Value Range   Preg Test, Ur NEGATIVE  NEGATIVE  TROPONIN I      Component Value Range   Troponin I <0.30  <0.30 ng/mL     COORDINATION OF CARE: 8:27pm: . - Patient evaluated by ED physician, Dilaudid, Zofran, CT Head, CT Abdominal, DG Chest, CBC, Diff, CMP, Lipase, UA, Pregnancy urine ordered  Radiology: CT Head Wo Contrast. Reviewed by me. IMPRESSION: No acute intracranial abnormalities. Original Report Authenticated By: Marlon Pel, M.D.  DG Chest 2 View. Reviewed by me. IMPRESSION: No evidence of active pulmonary disease. Original Report Authenticated By: Marlon Pel, M.D.     MDM  I personally performed the services described in this documentation, which was scribed in my presence. The recorded information has been reviewed and considered. Pt stable in ED with no significant deterioration in condition.Patient / Family / Caregiver informed of clinical course, understand medical decision-making process, and agree with plan.        Hurman Horn, MD 11/04/11 [phone]

## 2011-10-31 NOTE — Discharge Instructions (Signed)
You have neck pain, possibly from a cervical strain and/or pinched nerve.  SEEK IMMEDIATE MEDICAL ATTENTION IF: You develop difficulties swallowing or breathing.  You have new or worse numbness, weakness, tingling, or movement problems in your arms or legs.  You develop increasing pain which is uncontrolled with medications.  You have change in bowel or bladder function, or other concerns.  Abdominal (belly) pain can be caused by many things. Your caregiver performed an examination and possibly ordered blood/urine tests and imaging (CT scan, x-rays, ultrasound). Many cases can be observed and treated at home after initial evaluation in the emergency department. Even though you are being discharged home, abdominal pain can be unpredictable. Therefore, you need a repeated exam if your pain does not resolve, returns, or worsens. Most patients with abdominal pain don't have to be admitted to the hospital or have surgery, but serious problems like appendicitis and gallbladder attacks can start out as nonspecific pain. Many abdominal conditions cannot be diagnosed in one visit, so follow-up evaluations are very important. SEEK IMMEDIATE MEDICAL ATTENTION IF: The pain does not go away or becomes severe.  A temperature above 101 develops.  Repeated vomiting occurs (multiple episodes).  The pain becomes localized to portions of the abdomen. The right side could possibly be appendicitis. In an adult, the left lower portion of the abdomen could be colitis or diverticulitis.  Blood is being passed in stools or vomit (bright red or black tarry stools).  Return also if you develop chest pain, difficulty breathing, dizziness or fainting, or become confused, poorly responsive, or inconsolable (young children).  You appear to have an upper respiratory infection (URI). An upper respiratory tract infection, or cold, is a viral infection of the air passages leading to the lungs. It is contagious and can be spread to  others, especially during the first 3 or 4 days. It cannot be cured by antibiotics or other medicines. RETURN IMMEDIATELY IF you develop shortness of breath, confusion or altered mental status, a new rash, become dizzy, faint, or poorly responsive, or are unable to be cared for at home.

## 2011-11-01 NOTE — ED Notes (Signed)
Pt does not have a ride home unable to get taxi till am; pt states that she will wait in waiting room; states she has no one to transport her; comfort needs addressed

## 2011-11-01 NOTE — ED Notes (Signed)
Pt stable at discharge Verbalizes understanding Pt waiting till am till she leaves ER due to transportation issues

## 2011-11-18 ENCOUNTER — Encounter (HOSPITAL_COMMUNITY): Payer: Self-pay

## 2011-11-18 ENCOUNTER — Emergency Department (HOSPITAL_COMMUNITY)
Admission: EM | Admit: 2011-11-18 | Discharge: 2011-11-19 | Disposition: A | Discharge status: Other Acute Inpatient Hospital | Payer: Medicare Other | Source: Home / Self Care | Attending: Emergency Medicine | Admitting: Emergency Medicine

## 2011-11-18 DIAGNOSIS — F319 Bipolar disorder, unspecified: Secondary | ICD-10-CM | POA: Insufficient documentation

## 2011-11-18 DIAGNOSIS — F411 Generalized anxiety disorder: Secondary | ICD-10-CM | POA: Insufficient documentation

## 2011-11-18 DIAGNOSIS — Z79899 Other long term (current) drug therapy: Secondary | ICD-10-CM | POA: Insufficient documentation

## 2011-11-18 DIAGNOSIS — F329 Major depressive disorder, single episode, unspecified: Secondary | ICD-10-CM

## 2011-11-18 DIAGNOSIS — R45851 Suicidal ideations: Secondary | ICD-10-CM | POA: Insufficient documentation

## 2011-11-18 LAB — BASIC METABOLIC PANEL
BUN: 6 mg/dL (ref 6–23)
CO2: 22 mEq/L (ref 19–32)
Calcium: 9.1 mg/dL (ref 8.4–10.5)
Chloride: 101 mEq/L (ref 96–112)
Creatinine, Ser: 0.68 mg/dL (ref 0.50–1.10)
GFR calc Af Amer: >90 mL/min (ref >90)
GFR calc non Af Amer: >90 mL/min (ref >90)
Glucose, Bld: 80 mg/dL (ref 70–99)
Potassium: 3.5 mEq/L (ref 3.5–5.1)
Sodium: 136 mEq/L (ref 135–145)

## 2011-11-18 LAB — URINALYSIS, ROUTINE W REFLEX MICROSCOPIC
Bilirubin Urine: NEGATIVE
Glucose, UA: NEGATIVE mg/dL
Leukocytes, UA: NEGATIVE
Nitrite: NEGATIVE
Protein, ur: NEGATIVE mg/dL
Specific Gravity, Urine: <1.005 — ABNORMAL LOW (ref 1.005–1.030)
Urobilinogen, UA: 0.2 mg/dL (ref 0.0–1.0)
pH: 6.5 (ref 5.0–8.0)

## 2011-11-18 LAB — CBC WITH DIFFERENTIAL/PLATELET
Basophils Absolute: 0 10*3/uL (ref 0.0–0.1)
Basophils Relative: 0 % (ref 0–1)
Eosinophils Absolute: 0.2 10*3/uL (ref 0.0–0.7)
Eosinophils Relative: 2 % (ref 0–5)
HCT: 37.4 % (ref 36.0–46.0)
Hemoglobin: 13.1 g/dL (ref 12.0–15.0)
Lymphocytes Relative: 22 % (ref 12–46)
Lymphs Abs: 2.2 10*3/uL (ref 0.7–4.0)
MCH: 32.8 pg (ref 26.0–34.0)
MCHC: 35 g/dL (ref 30.0–36.0)
MCV: 93.7 fL (ref 78.0–100.0)
Monocytes Absolute: 0.6 10*3/uL (ref 0.1–1.0)
Monocytes Relative: 6 % (ref 3–12)
Neutro Abs: 6.9 10*3/uL (ref 1.7–7.7)
Neutrophils Relative %: 70 % (ref 43–77)
Platelets: 211 10*3/uL (ref 150–400)
RBC: 3.99 MIL/uL (ref 3.87–5.11)
RDW: 12.6 % (ref 11.5–15.5)
WBC: 9.9 10*3/uL (ref 4.0–10.5)

## 2011-11-18 LAB — URINE MICROSCOPIC-ADD ON

## 2011-11-18 LAB — RAPID URINE DRUG SCREEN, HOSP PERFORMED
Amphetamines: NOT DETECTED
Barbiturates: NOT DETECTED
Benzodiazepines: NOT DETECTED
Cocaine: NOT DETECTED
Opiates: NOT DETECTED
Tetrahydrocannabinol: NOT DETECTED

## 2011-11-18 LAB — PREGNANCY, URINE: Preg Test, Ur: NEGATIVE

## 2011-11-18 LAB — ETHANOL: Alcohol, Ethyl (B): <11 mg/dL (ref 0–11)

## 2011-11-18 NOTE — ED Notes (Signed)
[facility] team in with patient 

## 2011-11-18 NOTE — ED Notes (Signed)
Pt states she saw Dr Betti Cruz about a month ago and was started on new meds, states she stopped taking them about 2 weeks ago because "they aren't working"  Pt states family members are pressuring her to take her meds and tonight she got very anxious d/t same and called ems.  Pt admits that she "hates getting up in the morning"  And that she sometimes thinks about hurting herself but has no plan or previous attempts at same.   Pt is tearful.

## 2011-11-18 NOTE — BH Assessment (Signed)
Assessment Note   Jennifer Flores is an 46 y.o. female. PT PRESENTS WITH INCREASE DEPRESSION & SUICIDAL THOUGHTS WITH A PLAN TO OVERDOSE ON MEDS. PT EXPRESSED THAT SHE HAD BEEN DEPRESSED FOR AWHILE BUT IT HAS WORSEN & SHE FEELS HER MEDS ARE NOT HELPING. PT EXPRESSED THAT SHE NEEDED HER MEDS ADJUSTED & COULD NOT CONTRACT FOR SAFETY. PT EXPRESSED SHE HAD LOST INTEREST IN USUAL LIFE'S PLEASURE & HAD DECREASE APPETITE AS WELL AS SLEEP. PT DENIES ANY HI OR AV OR SA. PT HAS BEEN REFERRED TO [facility], PENDING DISPOSITION.   Axis I: Bipolar, Depressed Axis II: Deferred Axis III:  Past Medical History  Diagnosis Date  . Bipolar disease, chronic 2000    Dr. Betti Cruz /   . Suicide attempt   . Spider bite 2008    Seen in the ED   . Narcotic addiction   . Alcohol addiction   . Scabies   . Anxiety   . Panic attack   . Palpitations   . Depression    Axis IV: problems with primary support group Axis V: 11-20 some danger of hurting self or others possible OR occasionally fails to maintain minimal personal hygiene OR gross impairment in communication  Past Medical History:  Past Medical History  Diagnosis Date  . Bipolar disease, chronic 2000    Dr. Betti Cruz /   . Suicide attempt   . Spider bite 2008    Seen in the ED   . Narcotic addiction   . Alcohol addiction   . Scabies   . Anxiety   . Panic attack   . Palpitations   . Depression     Past Surgical History  Procedure Date  . Cesarean section 2004  . Extraction of teeth 2010    Family History:  Family History  Problem Relation Age of Onset  . Heart disease Mother   . Hypertension Mother   . Diabetes Father   . Anxiety disorder Father   . Anxiety disorder Sister     Social History:  reports that she has been smoking Cigarettes.  She has been smoking about 2 packs per day. She does not have any smokeless tobacco history on file. She reports that she does not drink alcohol or use illicit drugs.  Additional  Social History:     CIWA: CIWA-Ar BP: 126/88 mmHg Pulse Rate: 94  COWS:    Allergies: No Known Allergies  Home Medications:  (Not in a hospital admission)  OB/GYN Status:  Patient's last menstrual period was 11/15/2011.  General Assessment Data Location of Assessment: [facility] ACT Assessment: Yes Living Arrangements: Alone Can pt return to current living arrangement?: Yes Admission Status: Voluntary Is patient capable of signing voluntary admission?: Yes Transfer from: Acute Hospital Referral Source: MD     Risk to self Suicidal Ideation: Yes-Currently Present Suicidal Intent: Yes-Currently Present Is patient at risk for suicide?: Yes Suicidal Plan?: Yes-Currently Present Specify Current Suicidal Plan: OD Access to Means: Yes Specify Access to Suicidal Means: PILLS What has been your use of drugs/alcohol within the last 12 months?: NA Previous Attempts/Gestures: Yes How many times?: 3  Other Self Harm Risks: NA Triggers for Past Attempts: Other personal contacts Intentional Self Injurious Behavior: None Family Suicide History: No Recent stressful life event(s): Turmoil (Comment) Persecutory voices/beliefs?: Yes Depression: Yes Depression Symptoms: Loss of interest in usual pleasures;Despondent;Isolating;Insomnia;Fatigue Substance abuse history and/or treatment for substance abuse?: No Suicide prevention information given to non-admitted patients: Not applicable  Risk to Others Homicidal Ideation: No Thoughts of Harm to Others: No Current Homicidal Intent: No Current Homicidal Plan: No Access to Homicidal Means: No Identified Victim: NA History of harm to others?: No Assessment of Violence: None Noted Violent Behavior Description: CALM, COOPERATIVE Does patient have access to weapons?: No Criminal Charges Pending?: No Does patient have a court date: No  Psychosis Hallucinations: None noted Delusions: None noted  Mental Status Report Appear/Hygiene:  Disheveled;Body odor Eye Contact: Poor Motor Activity: Freedom of movement Speech: Logical/coherent Level of Consciousness: Alert Mood: Depressed;Anhedonia;Ambivalent;Despair Affect: Appropriate to circumstance;Depressed Anxiety Level: None Thought Processes: Coherent;Relevant Judgement: Unimpaired Orientation: Person;Place;Time;Situation Obsessive Compulsive Thoughts/Behaviors: None  Cognitive Functioning Concentration: Decreased Memory: Recent Intact;Remote Intact IQ: Average Insight: Poor Impulse Control: Poor Appetite: Poor Weight Loss: 20  Weight Gain: 0  Sleep: Decreased Total Hours of Sleep: 0  Vegetative Symptoms: None  ADLScreening [facility] Services) Patient's cognitive ability adequate to safely complete daily activities?: Yes Patient able to express need for assistance with ADLs?: Yes Independently performs ADLs?: Yes  Abuse/Neglect [facility]) Physical Abuse: Denies Verbal Abuse: Denies Sexual Abuse: Denies  Prior Inpatient Therapy Prior Inpatient Therapy: Yes Prior Therapy Dates: UNK Prior Therapy Facilty/Provider(s): [facility] Reason for Treatment: STABILIZATION  Prior Outpatient Therapy Prior Outpatient Therapy: Yes Prior Therapy Dates: CURRENT Prior Therapy Facilty/Provider(s): DR. Darlys Gales Reason for Treatment: MED MANAGEMENT  ADL Screening (condition at time of admission) Patient's cognitive ability adequate to safely complete daily activities?: Yes Patient able to express need for assistance with ADLs?: Yes Independently performs ADLs?: Yes       Abuse/Neglect Assessment (Assessment to be complete while patient is alone) Physical Abuse: Denies Verbal Abuse: Denies Sexual Abuse: Denies Values / Beliefs Cultural Requests During Hospitalization: None Spiritual Requests During Hospitalization: None        Additional Information 1:1 In Past 12 Months?: No CIRT Risk: No Elopement Risk: No Does patient have medical clearance?: Yes      Disposition:  Disposition Disposition of Patient: Inpatient treatment program;Referred to ([facility], PENDING DISPOSITION) Type of inpatient treatment program: Adult  On Site Evaluation by:   Reviewed with Physician:     Waldron Session 11/18/2011 10:16 PM

## 2011-11-18 NOTE — ED Provider Notes (Signed)
History     CSN: [id number]  Arrival date & time 11/18/11  2014   First MD Initiated Contact with Patient 11/18/11 2023      Chief Complaint  Patient presents with  . Medical Clearance    HPI Pt was seen at 2035.  Per pt, c/o gradual onset and worsening of persistent depression for the past several weeks.  Pt states she has not been taking her psychiatric meds in the past several weeks to month because they "aren't working." States she "can't get out of bed" and has not taken care of herself (daily ADL's) in "weeks." Endorses she has been "just laying in bed all the time."  States she "thinks of hurting herself" and "not wanting to live" but does not have a specific plan.  Denies HI, no SA.      Past Medical History  Diagnosis Date  . Bipolar disease, chronic 2000    Dr. Betti Cruz /   . Suicide attempt   . Spider bite 2008    Seen in the ED   . Narcotic addiction   . Alcohol addiction   . Scabies   . Anxiety   . Panic attack   . Palpitations   . Depression     Past Surgical History  Procedure Date  . Cesarean section 2004  . Extraction of teeth 2010    Family History  Problem Relation Age of Onset  . Heart disease Mother   . Hypertension Mother   . Diabetes Father   . Anxiety disorder Father   . Anxiety disorder Sister     History  Substance Use Topics  . Smoking status: Current Everyday Smoker -- 2.0 packs/day    Types: Cigarettes  . Smokeless tobacco: Not on file  . Alcohol Use: No     patient said she quit approx 2 years, did have an addiction problem --drinking tonight 12/30    Review of Systems ROS: Statement: All systems negative except as marked or noted in the HPI; Constitutional: Negative for fever and chills. ; ; Eyes: Negative for eye pain, redness and discharge. ; ; ENMT: Negative for ear pain, hoarseness, nasal congestion, sinus pressure and sore throat. ; ; Cardiovascular: Negative for chest pain, palpitations, diaphoresis, dyspnea and peripheral  edema. ; ; Respiratory: Negative for cough, wheezing and stridor. ; ; Gastrointestinal: Negative for nausea, vomiting, diarrhea, abdominal pain, blood in stool, hematemesis, jaundice and rectal bleeding. . ; ; Genitourinary: Negative for dysuria, flank pain and hematuria. ; ; Musculoskeletal: Negative for back pain and neck pain. Negative for swelling and trauma.; ; Skin: Negative for pruritus, rash, abrasions, blisters, bruising and skin lesion.; ; Neuro: Negative for headache, lightheadedness and neck stiffness. Negative for weakness, altered level of consciousness , altered mental status, extremity weakness, paresthesias, involuntary movement, seizure and syncope.; Psych:  +SI, +depression, +anxiety.  No SA, no HI, no hallucinations.      Allergies  Review of patient's allergies indicates no active allergies.  Home Medications   Current Outpatient Rx  Name Route Sig Dispense Refill  . ALPRAZOLAM 0.25 MG PO TABS  Take 1 by mouth each bedtime for insomnia 5 tablet 0  . CLONAZEPAM 1 MG PO TABS Oral Take 1 mg by mouth 3 (three) times daily.      Marland Kitchen HYDROXYZINE HCL 25 MG PO TABS Oral Take 25 mg by mouth at bedtime.      Marland Kitchen RISPERIDONE 3 MG PO TABS Oral Take 3 mg by mouth at bedtime.     Marland Kitchen  SERTRALINE HCL 100 MG PO TABS Oral Take 200 mg by mouth every morning.     . TRAZODONE HCL 100 MG PO TABS Oral Take 300 mg by mouth at bedtime.        BP 126/88  Pulse 94  Temp 98.3 F (36.8 C) (Oral)  Resp 20  SpO2 96%  LMP 11/15/2011  Physical Exam 2040: Physical examination:  Nursing notes reviewed; Vital signs and O2 SAT reviewed;  Constitutional: Well developed, Well nourished, Well hydrated, In no acute distress; Head:  Normocephalic, atraumatic; Eyes: EOMI, PERRL, No scleral icterus; ENMT: Mouth and pharynx normal, Mucous membranes moist; Neck: Supple, Full range of motion, No lymphadenopathy; Cardiovascular: Regular rate and rhythm, No murmur, rub, or gallop; Respiratory: Breath sounds clear & equal  bilaterally, No rales, rhonchi, wheezes.  Speaking full sentences with ease, Normal respiratory effort/excursion; Chest: Nontender, Movement normal; Abdomen: Soft, Nontender, Nondistended, Normal bowel sounds;; Extremities: Pulses normal, No tenderness, No edema, No calf edema or asymmetry.; Neuro: AA&Ox3, Major CN grossly intact.  Speech clear. No gross focal motor or sensory deficits in extremities.; Skin: Color normal, Warm, Dry.; Psych:  Affect flat, poor eye contact, tearful at times.     ED Course  Procedures   MDM  MDM Reviewed: nursing note and vitals Interpretation: labs   Results for orders placed during the hospital encounter of 11/18/11  CBC WITH DIFFERENTIAL      Component Value Range   WBC 9.9  4.0 - 10.5 K/uL   RBC 3.99  3.87 - 5.11 MIL/uL   Hemoglobin 13.1  12.0 - 15.0 g/dL   HCT 13.0  86.5 - 78.4 %   MCV 93.7  78.0 - 100.0 fL   MCH 32.8  26.0 - 34.0 pg   MCHC 35.0  30.0 - 36.0 g/dL   RDW 69.6  29.5 - 28.4 %   Platelets 211  150 - 400 K/uL   Neutrophils Relative 70  43 - 77 %   Neutro Abs 6.9  1.7 - 7.7 K/uL   Lymphocytes Relative 22  12 - 46 %   Lymphs Abs 2.2  0.7 - 4.0 K/uL   Monocytes Relative 6  3 - 12 %   Monocytes Absolute 0.6  0.1 - 1.0 K/uL   Eosinophils Relative 2  0 - 5 %   Eosinophils Absolute 0.2  0.0 - 0.7 K/uL   Basophils Relative 0  0 - 1 %   Basophils Absolute 0.0  0.0 - 0.1 K/uL  BASIC METABOLIC PANEL      Component Value Range   Sodium 136  135 - 145 mEq/L   Potassium 3.5  3.5 - 5.1 mEq/L   Chloride 101  96 - 112 mEq/L   CO2 22  19 - 32 mEq/L   Glucose, Bld 80  70 - 99 mg/dL   BUN 6  6 - 23 mg/dL   Creatinine, Ser 1.32  0.50 - 1.10 mg/dL   Calcium 9.1  8.4 - 44.0 mg/dL   GFR calc non Af Amer >90  >90 mL/min   GFR calc Af Amer >90  >90 mL/min  ETHANOL      Component Value Range   Alcohol, Ethyl (B) <11  0 - 11 mg/dL  URINE RAPID DRUG SCREEN (HOSP PERFORMED)      Component Value Range   Opiates NONE DETECTED  NONE DETECTED    Cocaine NONE DETECTED  NONE DETECTED   Benzodiazepines NONE DETECTED  NONE DETECTED   Amphetamines NONE DETECTED  NONE DETECTED   Tetrahydrocannabinol NONE DETECTED  NONE DETECTED   Barbiturates NONE DETECTED  NONE DETECTED  URINALYSIS, ROUTINE W REFLEX MICROSCOPIC      Component Value Range   Color, Urine YELLOW  YELLOW   APPearance CLEAR  CLEAR   Specific Gravity, Urine <1.005 (*) 1.005 - 1.030   pH 6.5  5.0 - 8.0   Glucose, UA NEGATIVE  NEGATIVE mg/dL   Hgb urine dipstick SMALL (*) NEGATIVE   Bilirubin Urine NEGATIVE  NEGATIVE   Ketones, ur TRACE (*) NEGATIVE mg/dL   Protein, ur NEGATIVE  NEGATIVE mg/dL   Urobilinogen, UA 0.2  0.0 - 1.0 mg/dL   Nitrite NEGATIVE  NEGATIVE   Leukocytes, UA NEGATIVE  NEGATIVE  PREGNANCY, URINE      Component Value Range   Preg Test, Ur NEGATIVE  NEGATIVE  URINE MICROSCOPIC-ADD ON      Component Value Range   Squamous Epithelial / LPF MANY (*) RARE   WBC, UA 3-6  <3 WBC/hpf   RBC / HPF 0-2  <3 RBC/hpf   Bacteria, UA FEW (*) RARE     2200:  ACT to eval.  Pt not caring for herself, laying in bed.  Needs admit.      Laray Anger, DO 11/20/11 1235

## 2011-11-18 NOTE — ED Notes (Signed)
ACT team completed eval of patient. Pt info being sent to [facility] behavioral health and [facility].

## 2011-11-19 ENCOUNTER — Inpatient Hospital Stay (HOSPITAL_COMMUNITY)
Admission: AD | Admit: 2011-11-19 | Discharge: 2011-11-30 | DRG: 885 | Disposition: A | Discharge status: Home / Self Care | Payer: Medicare Other | Source: Ambulatory Visit | Attending: Psychiatry | Admitting: Psychiatry

## 2011-11-19 DIAGNOSIS — F329 Major depressive disorder, single episode, unspecified: Secondary | ICD-10-CM

## 2011-11-19 DIAGNOSIS — F332 Major depressive disorder, recurrent severe without psychotic features: Secondary | ICD-10-CM | POA: Diagnosis present

## 2011-11-19 DIAGNOSIS — F3160 Bipolar disorder, current episode mixed, unspecified: Principal | ICD-10-CM | POA: Diagnosis present

## 2011-11-19 DIAGNOSIS — F411 Generalized anxiety disorder: Secondary | ICD-10-CM | POA: Diagnosis present

## 2011-11-19 DIAGNOSIS — F17200 Nicotine dependence, unspecified, uncomplicated: Secondary | ICD-10-CM | POA: Diagnosis present

## 2011-11-19 DIAGNOSIS — F3289 Other specified depressive episodes: Secondary | ICD-10-CM

## 2011-11-19 DIAGNOSIS — Z79899 Other long term (current) drug therapy: Secondary | ICD-10-CM

## 2011-11-19 MED ORDER — HYDROXYZINE HCL 25 MG PO TABS
25.0000 mg | ORAL_TABLET | Freq: Every day | ORAL | Status: DC
  Filled 2011-11-19 (×2): qty 1

## 2011-11-19 MED ORDER — HYDROXYZINE HCL 50 MG PO TABS: 50.0000 mg | ORAL_TABLET | ORAL | Status: DC | PRN

## 2011-11-19 MED ORDER — TRAZODONE HCL 100 MG PO TABS
300.0000 mg | ORAL_TABLET | Freq: Every day | ORAL | Status: DC
  Filled 2011-11-19 (×2): qty 3

## 2011-11-19 MED ORDER — ACETAMINOPHEN 325 MG PO TABS
650.0000 mg | ORAL_TABLET | Freq: Four times a day (QID) | ORAL | Status: DC | PRN
  Administered 2011-11-24: 650 mg via ORAL

## 2011-11-19 MED ORDER — ALUM & MAG HYDROXIDE-SIMETH 200-200-20 MG/5ML PO SUSP
30.0000 mL | ORAL | Status: DC | PRN
  Administered 2011-11-26: 30 mL via ORAL

## 2011-11-19 MED ORDER — GABAPENTIN 100 MG PO CAPS: 100.0000 mg | ORAL_CAPSULE | Freq: Three times a day (TID) | ORAL | Status: DC

## 2011-11-19 MED ORDER — MAGNESIUM HYDROXIDE 400 MG/5ML PO SUSP
30.0000 mL | Freq: Every day | ORAL | Status: DC | PRN
  Administered 2011-11-27: 30 mL via ORAL

## 2011-11-19 MED ORDER — RISPERIDONE 3 MG PO TABS
3.0000 mg | ORAL_TABLET | Freq: Every day | ORAL | Status: DC
  Filled 2011-11-19 (×2): qty 1

## 2011-11-19 MED ORDER — NICOTINE 21 MG/24HR TD PT24
MEDICATED_PATCH | TRANSDERMAL | Status: AC
  Administered 2011-11-19: 21 mg via TRANSDERMAL
  Filled 2011-11-19: qty 1

## 2011-11-19 MED ORDER — SERTRALINE HCL 100 MG PO TABS
200.0000 mg | ORAL_TABLET | ORAL | Status: DC
  Administered 2011-11-20 – 2011-11-30 (×11): 200 mg via ORAL
  Filled 2011-11-19 (×12): qty 2
  Filled 2011-11-19: qty 1
  Filled 2011-11-19: qty 2

## 2011-11-19 MED ORDER — RISPERIDONE 2 MG PO TABS
2.0000 mg | ORAL_TABLET | Freq: Every day | ORAL | Status: DC
  Administered 2011-11-19 – 2011-11-29 (×11): 2 mg via ORAL
  Filled 2011-11-19 (×14): qty 1

## 2011-11-19 MED ORDER — TRAZODONE HCL 100 MG PO TABS
100.0000 mg | ORAL_TABLET | Freq: Every day | ORAL | Status: DC
  Administered 2011-11-19: 100 mg via ORAL
  Filled 2011-11-19 (×4): qty 1

## 2011-11-19 NOTE — H&P (Signed)
Pt seen and evaluated upon admission.  Completed Admission Suicide Risk Assessment.  See orders.  Pt agreeable with plan.  Discussed with team.   

## 2011-11-19 NOTE — [facility] Suicide Risk Assessment (Signed)
Suicide Risk Assessment  Admission Assessment      Demographic factors: See chart.   Current Mental Status: Patient seen and evaluated. Chart reviewed. Pt has been laying in bed all day refusing to talk to team.  At this time, she reported sig depression and apathy.  She said she has been off her medications and just wants a "Xanax". Patient stated that her mood was "not good". Her affect was mood congruent and depressed. She denied any current thoughts of self injurious behavior, suicidal ideation or homicidal ideation. There were no auditory/visual hallucinations or mania noted.  Thought process was linear and goal directed. Sig psychomotor retardation was noted. Her speech was normal rate, tone and volume. Eye contact was fair. Judgment and insight are limited.    Loss Factors:  Sig hx depression and mood instability.  Historical Factors: pt would not elaborate, more information gathering necessary.   Risk Reduction Factors: pt said she would restart her outpt meds.   CLINICAL FACTORS: Mood Disorder NOS; r/o Benzodiazepine W/D (Klonopin and Xanax use in recent past.)  Patient Active Problem List  Diagnosis  . BIPOLAR DISORDER UNSPECIFIED  . NICOTINE ADDICTION  . DEPRESSION  . OTITIS EXTERNA  . ACUTE MAXILLARY SINUSITIS  . ACUTE CYSTITIS  . PELVIC INFLAMMATORY DISEASE  . VAGINITIS  . ABSCESS, VULVA  . POLYMENORRHEA  . ACQUIRED KERATODERMA  . LEG PAIN  . FATIGUE  . RASH AND OTHER NONSPECIFIC SKIN ERUPTION  . DERMATOMYCOSIS  . UNSPECIFIED CYSTITIS  . AMENORRHEA  . DERMATITIS  . DISORDER OF BONE AND CARTILAGE UNSPECIFIED  . Bipolar affective disorder, mixed    COGNITIVE FEATURES THAT CONTRIBUTE TO RISK:  Limited insight; apathy.  SUICIDE RISK: Pt viewed as a chronic increased risk of harm to self in light of her past hx and risk factors.  No acute safety concerns on the unit.  Pt contracting for safety and in need of crisis stabilization & Tx.  PLAN OF CARE: Pt admitted  for crisis stabilization and treatment.  Please see orders.  Restarted outpt meds.  Team contacted outpt pharmacy.  I reduced the dosages in light of recent noncompliance and risk for sedation.   Medications reviewed with pt and medication education provided. Will transfer pt to [address] for improved monitoring and continue q15 minute checks per unit protocol.  No clinical indication for one on one level of observation at this time.  Pt contracting for safety.  Mental health treatment, medication management and use of non addictive medications will mitigate against the increased risk of harm to self and/or others.  Discussed the importance of treatment with pt, as well as, tools to move forward in a healthy & safe manner.  Pt agreeable with the plan.  Discussed with the team.   Meds:     . nicotine      . risperiDONE  2 mg Oral QHS  . sertraline  200 mg Oral BH-q7a  . traZODone  100 mg Oral QHS  . DISCONTD: gabapentin  100 mg Oral TID  . DISCONTD: hydrOXYzine  25 mg Oral QHS  . DISCONTD: risperiDONE  3 mg Oral QHS  . DISCONTD: traZODone  300 mg Oral QHS    Lupe Carney 11/19/2011, 4:57 PM

## 2011-11-19 NOTE — Progress Notes (Signed)
Patient moved to [address], report given to [employer].

## 2011-11-19 NOTE — H&P (Signed)
Psychiatric Admission Assessment Adult  Patient Identification:  Jennifer Flores  Date of Evaluation:  11/19/2011  Chief Complaint:  Bipolar Disorder NOS 296.80  History of Present Illness: Patient adamantly refused to co-operate and or answer any questions from staff pertaining to her mental health and reason for her admission. She is currently lying in bed covering her facial areas with blanket, saying, "leave me alone to rest. I am not going to answer your questions. You might as well stand there".  PER ED PROVIDER NOTE: Pt was seen at 2035. Per pt, c/o gradual onset and worsening of persistent depression for the past several weeks. Pt states she has not been taking her psychiatric meds in the past several weeks to month because they "aren't working." States she "can't get out of bed" and has not taken care of herself (daily ADL's) in "weeks." Endorses she has been "just laying in bed all the time." States she "thinks of hurting herself" and "not wanting to live" but does not have a specific plan. Denies HI, no SA.      Mood Symptoms:  Anhedonia, Depression,  Depression Symptoms:  depressed mood, difficulty concentrating,  (Hypo) Manic Symptoms:  Irritable Mood,  Anxiety Symptoms:  Excessive Worry,  Psychotic Symptoms:  Hallucinations: None reported and or denied by patient  PTSD Symptoms: Had a traumatic exposure:  Declines to provide information.  Past Psychiatric History: Diagnosis: Bipolar affective disorder,  Hospitalizations: [facility]  Outpatient Care: Patient declines to answer this question  Substance Abuse Care: Patient declines to answer this question.  Self-Mutilation: No information provided by patient  Suicidal Attempts: Patient declines to answer this question  Violent Behaviors: Patient declines to provide this information.   Past Medical History:   Past Medical History  Diagnosis Date  . Bipolar disease, chronic 2000    Dr. Betti Cruz /   . Suicide attempt     . Spider bite 2008    Seen in the ED   . Narcotic addiction   . Alcohol addiction   . Scabies   . Anxiety   . Panic attack   . Palpitations   . Depression     Allergies:  No Known Allergies  PTA Medications: Prescriptions prior to admission  Medication Sig Dispense Refill  . ALPRAZolam (XANAX) 0.25 MG tablet Take 1 by mouth each bedtime for insomnia  5 tablet  0  . clonazePAM (KLONOPIN) 1 MG tablet Take 1 mg by mouth 3 (three) times daily.        . hydrOXYzine (ATARAX) 25 MG tablet Take 25 mg by mouth at bedtime.        . risperiDONE (RISPERDAL) 3 MG tablet Take 3 mg by mouth at bedtime.       . sertraline (ZOLOFT) 100 MG tablet Take 200 mg by mouth every morning.       . traZODone (DESYREL) 100 MG tablet Take 300 mg by mouth at bedtime.           Substance Abuse History in the last 12 months: Substance Age of 1st Use Last Use Amount Specific Type  Nicotine Patient declines to answer any questions during this assessment.     Alcohol      Cannabis      Opiates      Cocaine      Methamphetamines      LSD      Ecstasy      Benzodiazepines      Caffeine      Inhalants  Others:                         Consequences of Substance Abuse: Medical Consequences:  Liver damage Legal Consequences:  Arrests, jail time Family Consequences:  family discord  Social History: Current Place of Residence:  Chiropodist of Birth:    Family Members:  Marital Status:  Patient declines to answer  Children:  Sons:  Daughters:  Relationships:  Education:  Patient declines to provided this information.  Educational Problems/Performance:  Religious Beliefs/Practices:  History of Abuse (Emotional/Phsycial/Sexual)  Occupational Experiences;  Military History:  Patient declines to provide this information.  Legal History:  Hobbies/Interests:  Family History:   Family History  Problem Relation Age of Onset  . Heart disease Mother   . Hypertension Mother    . Diabetes Father   . Anxiety disorder Father   . Anxiety disorder Sister     Mental Status Examination/Evaluation: Objective:  Appearance: Disheveled  Eye Contact::  Poor  Speech:  Clear and Coherent  Volume:  Increased  Mood:  Angry, Depressed, Dysphoric and Irritable  Affect:  Blunt  Thought Process:  Illogical, Irrational  Orientation:  Full  Thought Content:  Rumination  Suicidal Thoughts:  Yes.  without intent/plan  Homicidal Thoughts:  No  Memory:  Immediate;   Good Recent;   Good Remote;   Good  Judgement:  Impaired  Insight:  Lacking  Psychomotor Activity:  Restlessness  Concentration:  Poor  Recall:  Good  Akathisia:  No  Handed:  Right  AIMS (if indicated):     Assets:  Desire for Improvement  Sleep:       Laboratory/X-Ray: None Psychological Evaluation(s)      Assessment:    AXIS I:  Bipolar affective disorder AXIS II:  Deferred AXIS III:   Past Medical History  Diagnosis Date  . Bipolar disease, chronic 2000    Dr. Betti Cruz /   . Suicide attempt   . Spider bite 2008    Seen in the ED   . Narcotic addiction   . Alcohol addiction   . Scabies   . Anxiety   . Panic attack   . Palpitations   . Depression    AXIS IV:  other psychosocial or environmental problems AXIS V:  11-20 some danger of hurting self or others possible OR occasionally fails to maintain minimal personal hygiene OR gross impairment in communication  Treatment Plan/Recommendations: Admit for safety and stabilization. Review and reinstate any pertinent home medications for other medical issues. Initiate Neurontin 100 mg tid for anxiety. Group counseling and activities.  Treatment Plan Summary: Daily contact with patient to assess and evaluate symptoms and progress in treatment Medication management  Current Medications:  Current Facility-Administered Medications  Medication Dose Route Frequency Provider Last Rate Last Dose  . acetaminophen (TYLENOL) tablet 650 mg  650 mg  Oral Q6H PRN Verne Spurr, PA-C      . alum & mag hydroxide-simeth (MAALOX/MYLANTA) 200-200-20 MG/5ML suspension 30 mL  30 mL Oral Q4H PRN Verne Spurr, PA-C      . magnesium hydroxide (MILK OF MAGNESIA) suspension 30 mL  30 mL Oral Daily PRN Verne Spurr, PA-C        Observation Level/Precautions:  Q 15 minute checks for safety  Laboratory:  Reviewed ED lab findings on file.  Psychotherapy:  Group  Medications:  See medication lists  Routine PRN Medications:  Yes  Consultations: None indicated at this time  Discharge Concerns:  Safety  Other:     Sanjuana Kava 7/12/201312:37 PM

## 2011-11-19 NOTE — Progress Notes (Signed)
Patient refused to complete admission process. Patient states "I'm tired I'm not going to talk to you right now." Patient did allow skin assessment and search with maximum encouragement. Patient refused standing BP and continued to refuse to speak with staff. Patient educated about unit with no response. Patient escorted to room in wheelchair after refusing to walk. Patient remains safe on unit with Q15 minute checks for safety. Will continue to monitor.

## 2011-11-19 NOTE — Progress Notes (Signed)
[id number] Patient with h/o depression here with suicidal ideation. Evaluated by ACT, paperwork sent to [facility] and [facility]. Holding orders on chart.

## 2011-11-19 NOTE — Progress Notes (Signed)
[facility] Group Notes:  (Counselor/Nursing/MHT/Case Management/Adjunct) 11/19/2011  1:15pm-2:30pm Relapse Prevention   Type of Therapy:  Group Therapy  Participation Level:  Did Not Attend - Ninoska would not get out of bed for group, stating that no one could make her get up if she doesn't want to.     Angus Palms, LCSW 11/19/2011  4:48 PM

## 2011-11-19 NOTE — Progress Notes (Signed)
Recreation Therapy Group Note  Date: 11/19/2011          Time: 1500      Group Topic/Focus: The focus of this group is on enhancing patients' problem solving skills, which involves identifying the problem, brainstorming solutions and choosing and trying a solution.    Participation Level: Did not attend  Participation Quality: Not Applicable  Affect: Not Applicable  Cognitive: Not Applicable   Additional Comments: "I am not getting up yet."   Ferrel Simington 11/19/2011 3:44 PM

## 2011-11-19 NOTE — Tx Team (Signed)
Initial Interdisciplinary Treatment Plan  PATIENT STRENGTHS: (choose at least two) Ability for insight Average or above average intelligence Capable of independent living Communication skills Supportive family/friends  PATIENT STRESSORS: Medication change or noncompliance Occupational concerns Substance abuse   PROBLEM LIST: Problem List/Patient Goals Date to be addressed Date deferred Reason deferred Estimated date of resolution  Depression 11/19/2011                                                      DISCHARGE CRITERIA:  Ability to meet basic life and health needs Adequate post-discharge living arrangements Improved stabilization in mood, thinking, and/or behavior Medical problems require only outpatient monitoring Motivation to continue treatment in a less acute level of care Reduction of life-threatening or endangering symptoms to within safe limits Verbal commitment to aftercare and medication compliance  PRELIMINARY DISCHARGE PLAN: Attend aftercare/continuing care group Attend PHP/IOP Outpatient therapy Return to previous living arrangement  PATIENT/FAMIILY INVOLVEMENT: This treatment plan has been presented to and reviewed with the patient, Jennifer Flores.  The patient and family have been given the opportunity to ask questions and make suggestions.  Nestor Ramp [facility] 11/19/2011, 5:47 PM

## 2011-11-19 NOTE — Progress Notes (Signed)
Patient encouraged to eat lunch. Patient verbalized "I'm not going to eat anything and you can't make me." Patient reminded that snacks will be available throughout the day, no response from patient. Will continue to monitor.

## 2011-11-19 NOTE — Progress Notes (Signed)
D: Pt has passive SI but agrees to contract for safety; pt has depressed mood and affect; pt complaining of being tired and upset; pt is currently in room resting A: Pt given emotional support from RN; pt encouraged to come to staff with concerns and/or questions; pt's medication routine continued; pt's orders and plan of care reviewed; pt was given a nicotine patch for cravings and a vistaril pill for anxiety and agitation by another RN R: Pt remains appropriate; will continue to monitor

## 2011-11-19 NOTE — Progress Notes (Signed)
Writer and NP attempted to meet with patient.  Patient very irritable and refused to talk.

## 2011-11-19 NOTE — ED Notes (Signed)
carelink her to transport

## 2011-11-19 NOTE — Progress Notes (Signed)
Psychoeducational Group Note  Date:  11/19/2011 Time:  1100  Group Topic/Focus:  Relapse Prevention Planning:   The focus of this group is to define relapse and discuss the need for planning to combat relapse.  Participation Level:  Did Not Attend   Additional Comments:  Pt did not attend group.   Dalia Heading 11/19/2011, 5:02 PM

## 2011-11-19 NOTE — Progress Notes (Signed)
[facility] Group Notes:  (Counselor/Nursing/MHT/Case Management/Adjunct)  11/19/2011 11:58 AM  Type of Therapy:  Psychoeducational Skills  Participation Level:  Did Not Attend   Jennifer Flores 11/19/2011, 11:58 AM

## 2011-11-19 NOTE — Progress Notes (Signed)
1610 Patient has been accepted at [facility], accepting MD Dr. Dan Humphreys. Transport by [employer] after 8 AM.

## 2011-11-20 MED ORDER — TRAZODONE HCL 50 MG PO TABS
50.0000 mg | ORAL_TABLET | Freq: Every day | ORAL | Status: DC
  Administered 2011-11-20: 50 mg via ORAL
  Filled 2011-11-20 (×4): qty 1

## 2011-11-20 MED ORDER — HYDROXYZINE HCL 25 MG PO TABS
25.0000 mg | ORAL_TABLET | Freq: Four times a day (QID) | ORAL | Status: DC | PRN
  Administered 2011-11-21 – 2011-11-30 (×20): 25 mg via ORAL
  Filled 2011-11-20 (×2): qty 1

## 2011-11-20 NOTE — Progress Notes (Signed)
Patient ID: Jennifer Flores, female   DOB: 1966/01/14, 46 y.o.   MRN: [id number]  [facility] Group Notes:  (Counselor/Nursing/MHT/Case Management/Adjunct)  11/20/2011 11 AM  Type of Therapy:  Aftercare Planning, Group Therapy, Dance/Movement Therapy   Participation Level:  Did Not Attend, Pt. sleeping   Rhunette Croft 11/20/2011. 11:31 AM

## 2011-11-20 NOTE — Progress Notes (Signed)
Psychoeducational Group Note  Date:  11/13/2011 Time:  0945 am  Group Topic/Focus:  Identifying Needs:   The focus of this group is to help patients identify their personal needs that have been historically problematic and identify healthy behaviors to address their needs.  Participation Level:  Did Not Attend     [place] Festa J 11/13/2011, 10:26 AM  

## 2011-11-20 NOTE — Progress Notes (Addendum)
Patient ID: Jennifer Flores, female   DOB: 1966/03/06, 46 y.o.   MRN: [id number] [facility] MD Progress Note  11/20/2011 7:01 PM  Diagnosis:  Axis I: Bipolar affective disorder  ADL's:  Impaired  Sleep:  Yes,  AEB:  Appetite: pt. States she is eating. Suicidal Ideation:  No                Plan No                Intent No                     Means No         Homicidal Ideation:   No  Plan:  No  Intent:  No  Means:  No  Subjective:  Pt. Is resting in bed. No eye contact.  She does nod her head to questions.  BP 132/89  Pulse 64  Temp 97.4 F (36.3 C) (Oral)  Resp 18  Ht 5' (1.524 m)  Wt 57.607 kg (127 lb)  BMI 24.80 kg/m2  LMP 11/15/2011 Objective: the patient states she is sleeping "alright" nods ok to appetite questions, denies SI/HI, states no AH/VH.  Then asks me to "come back when she is not so sleepy."    Mental Status: General Appearance  Behavior:   Eye Contact:   Motor Behavior:   Speech:   Level of Consciousness:   Mood:   Affect:   Anxiety Level:   Thought Process:  Thought Content:   Perception:   Judgment:   Insight:   Cognition:  Sleep:  Number of Hours: 6.75   Vital Signs:Blood pressure 132/89, pulse 64, temperature 97.4 F (36.3 C), temperature source Oral, resp. rate 18, height 5' (1.524 m), weight 57.607 kg (127 lb), last menstrual period 11/15/2011.  Lab Results:  Results for orders placed during the hospital encounter of 11/18/11 (from the past 48 hour(s))  CBC WITH DIFFERENTIAL     Status: Normal   Collection Time   11/18/11  8:38 PM      Component Value Range Comment   WBC 9.9  4.0 - 10.5 K/uL    RBC 3.99  3.87 - 5.11 MIL/uL    Hemoglobin 13.1  12.0 - 15.0 g/dL    HCT 14.7  82.9 - 56.2 %    MCV 93.7  78.0 - 100.0 fL    MCH 32.8  26.0 - 34.0 pg    MCHC 35.0  30.0 - 36.0 g/dL    RDW 13.0  86.5 - 78.4 %    Platelets 211  150 - 400 K/uL    Neutrophils Relative 70  43 - 77 %    Neutro Abs 6.9  1.7 - 7.7 K/uL    Lymphocytes Relative  22  12 - 46 %    Lymphs Abs 2.2  0.7 - 4.0 K/uL    Monocytes Relative 6  3 - 12 %    Monocytes Absolute 0.6  0.1 - 1.0 K/uL    Eosinophils Relative 2  0 - 5 %    Eosinophils Absolute 0.2  0.0 - 0.7 K/uL    Basophils Relative 0  0 - 1 %    Basophils Absolute 0.0  0.0 - 0.1 K/uL   BASIC METABOLIC PANEL     Status: Normal   Collection Time   11/18/11  8:38 PM      Component Value Range Comment   Sodium 136  135 - 145 mEq/L  Potassium 3.5  3.5 - 5.1 mEq/L    Chloride 101  96 - 112 mEq/L    CO2 22  19 - 32 mEq/L    Glucose, Bld 80  70 - 99 mg/dL    BUN 6  6 - 23 mg/dL    Creatinine, Ser 9.60  0.50 - 1.10 mg/dL    Calcium 9.1  8.4 - 45.4 mg/dL    GFR calc non Af Amer >90  >90 mL/min    GFR calc Af Amer >90  >90 mL/min   ETHANOL     Status: Normal   Collection Time   11/18/11  8:38 PM      Component Value Range Comment   Alcohol, Ethyl (B) <11  0 - 11 mg/dL   URINE RAPID DRUG SCREEN (HOSP PERFORMED)     Status: Normal   Collection Time   11/18/11  9:02 PM      Component Value Range Comment   Opiates NONE DETECTED  NONE DETECTED    Cocaine NONE DETECTED  NONE DETECTED    Benzodiazepines NONE DETECTED  NONE DETECTED    Amphetamines NONE DETECTED  NONE DETECTED    Tetrahydrocannabinol NONE DETECTED  NONE DETECTED    Barbiturates NONE DETECTED  NONE DETECTED   URINALYSIS, ROUTINE W REFLEX MICROSCOPIC     Status: Abnormal   Collection Time   11/18/11  9:03 PM      Component Value Range Comment   Color, Urine YELLOW  YELLOW    APPearance CLEAR  CLEAR    Specific Gravity, Urine <1.005 (*) 1.005 - 1.030    pH 6.5  5.0 - 8.0    Glucose, UA NEGATIVE  NEGATIVE mg/dL    Hgb urine dipstick SMALL (*) NEGATIVE    Bilirubin Urine NEGATIVE  NEGATIVE    Ketones, ur TRACE (*) NEGATIVE mg/dL    Protein, ur NEGATIVE  NEGATIVE mg/dL    Urobilinogen, UA 0.2  0.0 - 1.0 mg/dL    Nitrite NEGATIVE  NEGATIVE    Leukocytes, UA NEGATIVE  NEGATIVE   PREGNANCY, URINE     Status: Normal   Collection Time    11/18/11  9:03 PM      Component Value Range Comment   Preg Test, Ur NEGATIVE  NEGATIVE   URINE MICROSCOPIC-ADD ON     Status: Abnormal   Collection Time   11/18/11  9:03 PM      Component Value Range Comment   Squamous Epithelial / LPF MANY (*) RARE    WBC, UA 3-6  <3 WBC/hpf    RBC / HPF 0-2  <3 RBC/hpf    Bacteria, UA FEW (*) RARE     Physical Findings: AIMS: Facial and Oral Movements Muscles of Facial Expression: None, normal Lips and Perioral Area: None, normal Jaw: None, normal Tongue: None, normal,Extremity Movements Upper (arms, wrists, hands, fingers): None, normal Lower (legs, knees, ankles, toes): None, normal, Trunk Movements Neck, shoulders, hips: None, normal, Overall Severity Severity of abnormal movements (highest score from questions above): None, normal Incapacitation due to abnormal movements: None, normal Patient's awareness of abnormal movements (rate only patient's report): No Awareness, Dental Status Current problems with teeth and/or dentures?: No Does patient usually wear dentures?: No  CIWA:    COWS:     Treatment Plan Summary: 1. Daily contact with the patient and medication management. 2. Continue to encourage medication with patient and offer support.   Plan: 1.No changes in medication at this time. Andreika Vandagriff 11/20/2011, 7:01 PM

## 2011-11-20 NOTE — Progress Notes (Signed)
Psychoeducational Group Note  Date:  11/20/2011 Time:  2000  Group Topic/Focus:  Wrap-Up Group:   The focus of this group is to help patients review their daily goal of treatment and discuss progress on daily workbooks.  Participation Level:  Did Not Attend  Participation Quality:  Did not attend   Affect:  Did not attend   Cognitive:  Did not attend   Insight:  Did not attend   Engagement in Group:  Did not attend  Additional Comments: pt didn't attend group.   Patrina Andreas A 11/20/2011, 2:33 AM

## 2011-11-20 NOTE — Progress Notes (Signed)
Patient ID: Jennifer Flores, female   DOB: [birth year], 46 y.o.   MRN: [id number] D. The patient isolated in her room all evening. Has a pensive affect. Strong body odor and disheveled appearance. A. Q 15 minute checks maintained for safety. Engaged in a brief 1:1 interaction to assess for suicidal risk and auditory hallucinations. Encouraged to attend evening wrap up group. Offered to wash clothes and encouraged to shower or bathe. Medications explained and administered. R. The patient did not attend evening group. She stayed in her room except to come out for medication. She refused to shower or have her clothes washed. Remains disheveled and malodorous. Will continue to monitor.

## 2011-11-20 NOTE — Progress Notes (Signed)
Pt has been in room for much of the day today, has been unable to attend or participate in various milieu activities today, pt does endorse some feelings of depression today, interaction has been minimal and limited and has really not wanted to talk much today, pt provided with support and encouragement during the day today, will continue to monitor

## 2011-11-20 NOTE — Significant Event (Signed)
Pt was ambulating down the hall this evening approximately 1730 to go to cafeteria for dinner, pt had syncopal episode and at this time became dizzy and lightheaded, as staff were around they were able to prevent any type of fall and lowered pt to the floor, pt was fully alert on the ground and stated that she felt dizzy, pt blood pressure was taken and found to be 132/89 at that time, pt stated that she was ok to get up and was assisted by staff to a wheelchair and escorted back to the hall where she had dinner and continued to be monitored and no further distress was to be noted

## 2011-11-21 MED ORDER — TRAZODONE 25 MG HALF TABLET
25.0000 mg | ORAL_TABLET | Freq: Every day | ORAL | Status: DC
  Administered 2011-11-21 – 2011-11-22 (×2): 25 mg via ORAL
  Filled 2011-11-21 (×4): qty 1

## 2011-11-21 MED ORDER — NICOTINE 21 MG/24HR TD PT24
21.0000 mg | MEDICATED_PATCH | Freq: Every day | TRANSDERMAL | Status: DC
  Administered 2011-11-21 – 2011-11-30 (×10): 21 mg via TRANSDERMAL
  Filled 2011-11-21 (×12): qty 1

## 2011-11-21 NOTE — Progress Notes (Signed)
Patient ID: Jennifer Flores, female   DOB: Nov 20, 1965, 46 y.o.   MRN: [id number] Jennifer Flores  46 y.o.  [id number] 08/01/1965  11/21/2011   Diagnosis: Bipolar Affective disorder  Subjective: Patient is awake and oriented today.  Her eye contact is good.  Her speech is clear.  Jennifer Flores clearly states she thinks the trazodone is too strong for her. She is pleasant and cooperative and feels that she is much better today and is asking about her discharge.  Vital Signs:Blood pressure 107/76, pulse 106, temperature 97.9 F (36.6 C), temperature source Oral, resp. rate 20, height 5' (1.524 m), weight 57.607 kg (127 lb), last menstrual period 11/15/2011.   Objective: She is alert and oriented, speech is clear, denies SI/HI, states no AH/VH.  States her depession is a 6/10 and her anxiety is a 10/10. She denies delusions or feelings of persecution.  Medications Scheduled:  . nicotine  21 mg Transdermal Daily  . risperiDONE  2 mg Oral QHS  . sertraline  200 mg Oral BH-[id number]  . traZODone  50 mg Oral QHS  . DISCONTD: traZODone  100 mg Oral QHS     PRN Meds acetaminophen, alum & mag hydroxide-simeth, hydrOXYzine, magnesium hydroxide  Plan: 1. Will cut the trazodone in half with order to give the other half in 1 hour if patient is not asleep. 2. Will review home medications and continue to follow. Jennifer Flores. Jennifer Flores [facility] 11/21/2011 4:22 PM

## 2011-11-21 NOTE — Progress Notes (Signed)
Psychoeducational Group Note  Date:  11/21/2011 Time:  0945 am  Group Topic/Focus:  Making Healthy Choices:   The focus of this group is to help patients identify negative/unhealthy choices they were using prior to admission and identify positive/healthier coping strategies to replace them upon discharge.  Participation Level:  None  Participation Quality:  Resistant  Affect:  Blunted  Cognitive:  Alert  Insight:  Limited  Engagement in Group:  None  Additional Comments:    Andrena Mews 11/21/2011, 10:29 AM

## 2011-11-21 NOTE — Progress Notes (Signed)
Psychoeducational Group Note  Date:  11/21/2011 Time:  1515  Group Topic/Focus:  Conflict Resolution:   The focus of this group is to discuss the conflict resolution process and how it may be used upon discharge.  Participation Level:  Did Not Attend  Participation Quality:    Affect:    Cognitive:    Insight:    Engagement in Group:    Additional Comments:  none  Jennifer Flores Celcia 11/21/2011, 6:03 PM
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
## 2011-11-21 NOTE — Progress Notes (Signed)
[facility] Adult Inpatient Family/Significant Other Suicide Prevention Education  Suicide Prevention Education:  Patient Refusal for Family/Significant Other Suicide Prevention Education: The patient Jennifer Flores has refused to provide written consent for family/significant other to be provided Family/Significant Other Suicide Prevention Education during admission and/or prior to discharge.  She reports that her siblings live in [place] and her mother lives in [place].  She states she does not have any other family support. Physician notified.  Marni Griffon C 11/21/2011, 4:00 PM

## 2011-11-21 NOTE — Progress Notes (Signed)
Pt has been up and has been active while in the milieu today, has been attending and participating in various activities on the unit, pt did speak today about feeling better today than yesterday and feels the trazadone she receives at night may be too strong for her, pt did express some thoughts of depression and anxiety today but denied any suicidal thoughts, pt has received medications today without incident, support provided, will continue to monitor

## 2011-11-21 NOTE — Progress Notes (Signed)
Patient ID: VENEDA KIRKSEY, female   DOB: 03/29/1966, 46 y.o.   MRN: [id number]  [facility] Group Notes:  (Counselor/Nursing/MHT/Case Management/Adjunct)  11/21/2011 11 AM  Type of Therapy:  Aftercare Planning, Group Therapy, Dance/Movement Therapy   Participation Level:  Minimal  Participation Quality:  Inattentive  Affect:  Appropriate  Cognitive:  Confused  Insight:  Limited  Engagement in Group:  Limited  Engagement in Therapy:  Limited  Modes of Intervention:  Clarification, Problem-solving, Role-play, Socialization and Support  Summary of Progress/Problems: After Care: Pt. attended and participated in aftercare planning group. Pt. accepted information on suicide prevention, warning signs to look for with suicide and crisis line numbers to use. The pt. agreed to call crisis line numbers if having warning signs or having thoughts of suicide. Pt. listed their current mood as feeling like the red.  "I feel good today". Counseling: Therapist discussed the purpose of a healthy support system.  Therapist discussed how healthy supports can create feelings of happiness, security and comfort. The role negative supports can play in our lives and how that can bring about feelings of insecurity and helplessness.  Therapist asked group to name one support in their lives and in what way are they supportive.   Pt. was alert in group; however, pt was quite and stated ,"I am my support".   Rhunette Croft 11/21/2011. 11:42 AM
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
## 2011-11-22 DIAGNOSIS — F411 Generalized anxiety disorder: Secondary | ICD-10-CM

## 2011-11-22 DIAGNOSIS — F331 Major depressive disorder, recurrent, moderate: Secondary | ICD-10-CM

## 2011-11-22 DIAGNOSIS — F332 Major depressive disorder, recurrent severe without psychotic features: Secondary | ICD-10-CM | POA: Diagnosis present

## 2011-11-22 MED ORDER — GABAPENTIN 100 MG PO CAPS
200.0000 mg | ORAL_CAPSULE | ORAL | Status: DC
  Administered 2011-11-23 (×2): 200 mg via ORAL
  Filled 2011-11-22 (×8): qty 2

## 2011-11-22 NOTE — Progress Notes (Addendum)
D: At nurses station, requesting prn for anxiety on approach. Appears flat and depressed. Anxious in presentation. Has been visible in milieu but minimally interactive. No acute distress noted. States she has had a bad day r/t finding out that 5 cop cars and a K-9 unit were at her house today, looking for her. States her mother was concerned about her whereabouts (did not know she was here) and called the cops to check on her/find her. She is concerned she may get evicted r/t the Cops presence and the landlord assuming it is related to something illegal. Denies SI/HI/AVH and contract for safety. Did request prn for anxiety.  A: Support and encouragement provided. Encouraged to be open minded about her living situation try not to assume her landlord believes she is up to something illegal. Safety has been maintained with Q15 minute observation.  POC and medications reviewed and understanding verbalized. Medications given as ordered by MD. PRN provided for anxiety  R: Pt remains safe. Is compliant with treatment goals. Offers no additional concerns. Will f/u response to prn, continue Q15 minute observation and continue current POC.

## 2011-11-22 NOTE — Progress Notes (Signed)
Psychoeducational Group Note  Date:  11/22/2011 Time:  11.00  Group Topic/Focus:  Self Care:   The focus of this group is to help patients understand the importance of self-care in order to improve or restore emotional, physical, spiritual, interpersonal, and financial health.  Participation Level:  Active  Participation Quality:  Appropriate and Attentive  Affect:  Appropriate  Cognitive:  Appropriate and Oriented  Insight:  Good  Engagement in Group:  Good  Additional Comments:  Pt participated and processed in group  [place], Jennifer Flores 11/22/2011, 6:08 PM

## 2011-11-22 NOTE — Progress Notes (Signed)
Pt has been up throughout the day today, has been attending and participating in various milieu activities today, pt has stated that she is feeling better, talked about feeling "grouchy" when she came in but no longer is, did mention that she has been having feelings of anxiety and has requested and received PRN vistaril today to help with anxiety, pt has been provided with support and encouragement throughout the day today, will continue to monitor

## 2011-11-22 NOTE — Progress Notes (Signed)
Psychoeducational Group Note  Date:  11/22/2011 Time:  2030  Group Topic/Focus:  Wrap-Up Group:   The focus of this group is to help patients review their daily goal of treatment and discuss progress on daily workbooks.  Participation Level:  None  Participation Quality:  Appropriate  Affect:  Appropriate, Blunted and Flat  Cognitive:  Appropriate  Insight:  None  Engagement in Group:  None  Additional Comments:  Pt attended group but did not participate  Dalia Heading 11/22/2011, 11:29 PM

## 2011-11-22 NOTE — Progress Notes (Signed)
Patient ID: Jennifer Flores, female   DOB: 24-Jan-1966, 46 y.o.   MRN: [id number] D. The patient spent the evening in the dayroom interacting with the milieu. She looks less anxious and engages easily in conversation. Maintains good eye contact. Her appearance is still somewhat disheveled. A. Q 15 minute checks maintained for safety. Engaged in 1:1 to assess. Medicated with scheduled HS medications. R. The patient denies any suicidal ideation. Denies any A/V hallucinations.

## 2011-11-22 NOTE — Progress Notes (Signed)
[facility] Group Notes:  (Counselor/Nursing/MHT/Case Management/Adjunct)  11/22/2011 12:38 AM  Type of Therapy:  wrap-up  Participation Level:  Minimal  Participation Quality:  Appropriate  Affect:  Appropriate  Cognitive:  Appropriate  Insight:  Good  Engagement in Group:  Good  Engagement in Therapy:  Good  Modes of Intervention:  Education  Summary of Progress/Problems:Patient attended and participated in our wrap-up group this evening. She reports that today she went outside, took a shower,and meet a new friend. The topic for today was support systems. Pt reports that her support was [employer]. She will like to get her driver licenses so she could visit with family  Scot Dock 11/22/2011, 12:38 AM

## 2011-11-22 NOTE — Progress Notes (Signed)
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   Generalized Anxiety Disorder.  The patient was seen today and reports the following:   ADL's: Intact.  Sleep: The patient reports to having some difficulty initiating and maintaining sleep.  Appetite: The patient reports a good appetite this morning.   Mild>(1-10) >Severe  Hopelessness (1-10): 0  Depression (1-10): 4 Anxiety (1-10): 6  Suicidal Ideation: The patient denies any suicidal ideations today.  Plan: No  Intent: No  Means: No   Homicidal Ideation: The patient denies any homicidal ideations today.  Plan: No  Intent: No.  Means: No   General Appearance/Behavior: The patient was friendly and cooperative today with this provider.  Eye Contact: Good.  Speech: Appropriate in rate and volume with no pressuring noted today.  Motor Behavior: wnl.  Level of Consciousness: Alert and Oriented x 3.  Mental Status: Alert and Oriented x 3.  Mood: Appears mild to moderately depressed today.  Affect: Appears mild to moderately constricted.  Anxiety Level: Moderate anxiety reported today.  Thought Process: wnl.  Thought Content: The patient denies any auditory or visual hallucinations today as well as any delusional thinking.  Perception: wnl.  Judgment: Good.  Insight: Good.  Cognition: Oriented to person, place and time.  Sleep:  Number of Hours: 6.5    Vital Signs:Blood pressure 93/64, pulse 94, temperature 98.2 F (36.8 C), temperature source Oral, resp. rate 20, height 5' (1.524 m), weight 57.607 kg (127 lb), last menstrual period 11/15/2011.  Current Medications: Current Facility-Administered Medications  Medication Dose Route Frequency Provider Last Rate Last Dose  . acetaminophen (TYLENOL) tablet 650 mg  650 mg Oral Q6H PRN Verne Spurr, PA-C      . alum & mag hydroxide-simeth (MAALOX/MYLANTA) 200-200-20 MG/5ML suspension 30 mL  30 mL Oral Q4H  PRN Verne Spurr, PA-C      . gabapentin (NEURONTIN) capsule 200 mg  200 mg Oral [id number] Curlene Labrum Readling, MD      . hydrOXYzine (ATARAX/VISTARIL) tablet 25 mg  25 mg Oral Q6H PRN Verne Spurr, PA-C   25 mg at 11/22/11 2023  . magnesium hydroxide (MILK OF MAGNESIA) suspension 30 mL  30 mL Oral Daily PRN Verne Spurr, PA-C      . nicotine (NICODERM CQ - dosed in mg/24 hours) patch 21 mg  21 mg Transdermal Daily Curlene Labrum Readling, MD   21 mg at 11/22/11 0816  . risperiDONE (RISPERDAL) tablet 2 mg  2 mg Oral QHS Alyson Kuroski-Mazzei, DO   2 mg at 11/22/11 2141  . sertraline (ZOLOFT) tablet 200 mg  200 mg Oral Lowella Curb, NP   200 mg at 11/22/11 4098  . traZODone (DESYREL) tablet 25 mg  25 mg Oral QHS Verne Spurr, PA-C   25 mg at 11/22/11 2141   Lab Results: No results found for this or any previous visit (from the past 48 hour(s)).  Physical Findings: AIMS: Facial and Oral Movements Muscles of Facial Expression: None, normal Lips and Perioral Area: None, normal Jaw: None, normal Tongue: None, normal,Extremity Movements Upper (arms, wrists, hands, fingers): None, normal Lower (legs, knees, ankles, toes): None, normal, Trunk Movements Neck, shoulders, hips: None, normal, Overall Severity Severity of abnormal movements (highest score from questions above): None, normal Incapacitation due to abnormal movements: None, normal Patient's awareness of abnormal movements (rate only patient's report): No Awareness, Dental Status Current problems with teeth and/or dentures?:  No Does patient usually wear dentures?: No   Review of Systems:  Neurological: The patient denies any headaches today. She also denies any seizure history.  G.I.: The patient denies any constipation or G.I. Upset today.  Musculoskeletal: The patient denies any muscle or skeletal difficulties.   Time was spent today discussing with the patient her current symptoms. The patient states that she is having some  difficulty initiating and maintaining sleep but reports a good appetite. The patient reports mild to moderate feelings of sadness, anhedonia and depressed mood and denies any suicidal or homicidal ideations. The patient reports moderate anxiety today and denies any auditory or visual hallucinations or delusional thinking.    Treatment Plan Summary:  1. Daily contact with patient to assess and evaluate symptoms and progress in treatment.  2. Medication management  3. The patient will deny suicidal ideations or homicidal ideations for 48 hours prior to discharge and have a depression and anxiety rating of 3 or less. The patient will also deny any auditory or visual hallucinations or delusional thinking.  4. The patient will deny any symptoms of substance withdrawal at time of discharge.   Plan:  1. Will continue the medication Zoloft at 200 mgs po q am for depression and anxiety. 2. Will continue the medication Risperdal at 2 mgs po qhs for mood stabilization.  3. Will continue the medication Trazodone at 25 mgs po qhs for sleep. 4. Will start the medication Neurontin at 200 mgs po q am, 2 pm and hs for anxiety. 5. Laboratory studies reviewed.  6. Will continue to monitor.    RANDY READLING 11/22/2011, 10:14 PM

## 2011-11-22 NOTE — Tx Team (Signed)
Interdisciplinary Treatment Plan Update (Adult)  Date:  11/22/2011  Time Reviewed:  9:35 AM   Progress in Treatment: Attending groups:   Yes   Participating in groups:  Yes Taking medication as prescribed:  Yes Tolerating medication:  Yes Family/Significant othe contact made: Patient declined to allow family contact Patient understands diagnosis:  Yes Discussing patient identified problems/goals with staff: Yes Medical problems stabilized or resolved: Yes Denies suicidal/homicidal ideation:Yes Issues/concerns per patient self-inventory:  Other:  New problem(s) identified:  Reason for Continuation of Hospitalization: Medication stabilization  Interventions implemented related to continuation of hospitalization:  Changing sleep medication   Medication Management; safety checks q 15 mins  Additional comments:  Estimated length of stay: 1-2 days  Discharge Plan:  Home with outpatient follow up  New goal(s):  Review of initial/current patient goals per problem list:   1.  Goal(s):  Eliminate SI  Met:  Yes    Target date:  d/c  As evidenced by: Patient will no longer endorse SI/other thought self harm  2.  Goal (s):  Reduce depression/anxiety (rated at four/six today)  Met:  No  Target date: d/c  As evidenced by:  Patient will currently rating symptoms at four or below  3.  Goal(s):   .stabilize on meds   Met:  No  Target date: d/c  As evidenced by:  Patient will report being stable on medications - symptoms have decreased    4.  Goal(s):   Refer for outpatient follow up  Met:  No  Target date: d/c  As evidenced by:  Follow up appointment to be scheduled with [facility] in [place]  Attendees: Patient:  Jennifer Flores 11/22/2011 9:47 AM  Nursing:  Paralee Cancel, RN 11/22/2011 9:55 AM  Physician:  Dr. Franchot Gallo, MD 11/22/2011 9:35 AM   Nursing:   Joslyn Devon  RN 11/22/2011 9:35 AM   CaseManager:  Juline Patch, LCSW 11/22/2011 9:35 AM     Counselor:  Marni Griffon, LCAS 11/22/2011 9:35 AM

## 2011-11-22 NOTE — Progress Notes (Addendum)
Writer met with patient to assess for discharge planning needs.  She reports doing better and denies SI/HI.  She denies any symptoms at this time.  Patient reports admitting to the hospital due to having problems with Trazadone.  She shared that she does not feel ready to discharge home at this time.  Patient advised she will need transportation home and is established with Medicaid transportation.  Patient reports having home and assess to meds.  She also has outpatient follow up.  Per State Regulation 482.30 This chart was reviewed for medical necessity with respect to the patient's  Admission/Duration of Stay  [facility], LCSW @7 /15/2013    Next Review Date 11/25/11   Per MD request, medication record requested from [facility].  Awaiting response.  Horace Porteous Woodrow Drab, LCSW

## 2011-11-22 NOTE — Progress Notes (Signed)
[facility] Group Notes:  (Counselor/Nursing/MHT/Case Management/Adjunct)  11/22/2011 2:15 PM  Type of Therapy: Group Therapy   Participation Level: Minimal   Participation Quality: Limited  Affect: Blunted, depressed   Cognitive: oriented, alert   Insight: minimal  Engagement in Group: Limited  Modes of Intervention: Clarification, Education, Problem-solving, Socialization, Activity, Encouragement and Support   Summary of Progress/Problems: Pt participated in group by listening attentively and self disclosing.  Therapist addressed the whole person concept of Wellness by prompting Pts to identify things that made them feel good in each of the following areas:  Physical, Mental, Emotional, and Spiritual.  Therapist encouraged Pts to make a list of things they can do each day to include positive activities in each area.  Pt explained the Positive Affirmation Activity.  Pt actively participated by giving a positive affirmations to another Pt.  Pt was open to feedback from others, responded with a smile, and acknowledged elevation of mood. Therapist offered support and encouragement.  Some progress noted.  Intervention effective.         Marni Griffon C 11/22/2011, 2:15 PM

## 2011-11-23 MED ORDER — TRAZODONE HCL 50 MG PO TABS
50.0000 mg | ORAL_TABLET | Freq: Every day | ORAL | Status: DC
  Administered 2011-11-23: 50 mg via ORAL
  Filled 2011-11-23 (×3): qty 1

## 2011-11-23 MED ORDER — GABAPENTIN 400 MG PO CAPS
400.0000 mg | ORAL_CAPSULE | ORAL | Status: DC
  Administered 2011-11-23 – 2011-11-30 (×21): 400 mg via ORAL
  Filled 2011-11-23 (×27): qty 1

## 2011-11-23 NOTE — Progress Notes (Signed)
[facility] Group Notes:  (Counselor/Nursing/MHT/Case Management/Adjunct)  11/23/2011  10:30  AM  Type of Therapy: Group Therapy   Participation Level: Minimal   Participation Quality: Limited  Affect: Blunted, Depressed  Cognitive: oriented, alert   Insight: Poor  Engagement in Group: Limited  Modes of Intervention: Clarification, Education, Problem-solving, Socialization, Activity, Encouragement and Support   Summary of Progress/Problems: Pt participated in group by listening attentively and self disclosing.  After a brief check-in, therapist introduced the topic of Recovery and explained how recovery applied to mental as well as physical health.  Therapist explained that development of good coping strategies helped enhance recovery.  Therapist prompted patients to identify how they feel personally when they are beginning to feel angry, anxious, sad, depressed, or wanting to use alcohol or drugs.   Therapist prompted patients to verbalize examples of situations, people, places, or things that trigger those feelings.  Therapist asked patients to identify activities or techniques that help cope with those feelings. Pt was minimally engaged in the process.  Therapist encouraged Pts to take a personal inventory of each day and praise themselves for using positive coping skills.  Therapist offered support and encouragement.  Progress noted.  Intervention effective.         Marni Griffon C 11/23/2011  10:30 AM

## 2011-11-23 NOTE — Progress Notes (Signed)
[facility] Group Notes:  (Counselor/Nursing/MHT/Case Management/Adjunct)  11/23/2011 8:00PM  Type of Therapy:  Group Therapy  Participation Level:  Minimal  Participation Quality:  Attentive  Affect:  Flat  Cognitive:  Alert  Insight:  Limited  Engagement in Group:  Limited  Engagement in Therapy:  Limited  Modes of Intervention:  Clarification and Support  Summary of Progress/Problems: Pt had limited participation in group.  Shannette Tabares, Randal Buba 11/23/2011, 10:39 PM

## 2011-11-23 NOTE — Progress Notes (Signed)
D: Has been visible on milieu and interacting appropriately with peers. Appears flat and depressed. Calm and cooperative with assessment. No acute distress noted. States she has had a anxious day. States she wants to be started back on Abilify but the doctor has not restarted it. Feels like she has been unusually anxious and the Abilify helps with that. Otherwise offered no questions or concerns. Denies SI/HI/AVH and contracts for safety.  A: Safety has been maintained with Q15 minute observation. Support and encouragement offered. Discussed the fact that sometimes it is necessary to try other alternatives when the MD feels like a pt could benefit from it vs previous meds that she historically stabilized on. Encouraged to be patient and see how the new medication works for her before passing judgement. She said she will try to be patient. Medications given as ordered by MD.  R: Pt remains safe. Is compliant with treatment plan and is compliant with medications. Will continue current POC and continue Q15 minute observation.

## 2011-11-23 NOTE — Progress Notes (Signed)
Pt in bed and is sleepy this morning. Pt forwards little and when writer asked about hallucinations, pt reluctantly reported not at this time.  Pt later came to medication window and requested vistaril for anxiety. Offered prn medication, 15 minute checks and support. Encouraged pt to attend groups and interact with staff and peers. Pt reported to Clinical research associate that she did attend morning group. Pt tolerating medications well. She denies si and hi. Safety maintained on unit.

## 2011-11-23 NOTE — Progress Notes (Signed)
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  Generalized Anxiety Disorder.   The patient was seen today and reports the following:   ADL's: Intact.  Sleep: The patient reports to having some ongoing difficulty initiating and maintaining sleep.  Appetite: The patient reports a good appetite this morning.   Mild>(1-10) >Severe  Hopelessness (1-10): 6  Depression (1-10): 7  Anxiety (1-10): 8   Suicidal Ideation: The patient denies any suicidal ideations today.  Plan: No  Intent: No  Means: No   Homicidal Ideation: The patient denies any homicidal ideations today.  Plan: No  Intent: No.  Means: No   General Appearance/Behavior: The patient remains friendly and cooperative today with this provider.  Eye Contact: Good.  Speech: Appropriate in rate and volume with no pressuring noted today.  Motor Behavior: wnl.  Level of Consciousness: Alert and Oriented x 3.  Mental Status: Alert and Oriented x 3.  Mood: Appears moderately depressed today.  Affect: Appears moderately constricted.  Anxiety Level: Moderate to severe anxiety reported today.  Thought Process: wnl.  Thought Content: The patient denies any auditory or visual hallucinations today as well as any delusional thinking.  Perception: wnl.  Judgment: Good.  Insight: Good.  Cognition: Oriented to person, place and time.  Sleep:  Number of Hours: 6.5    Vital Signs:Blood pressure 93/64, pulse 94, temperature 98.2 F (36.8 C), temperature source Oral, resp. rate 20, height 5' (1.524 m), weight 57.607 kg (127 lb), last menstrual period 11/15/2011.  Current Medications: Current Facility-Administered Medications  Medication Dose Route Frequency Provider Last Rate Last Dose  . acetaminophen (TYLENOL) tablet 650 mg  650 mg Oral Q6H PRN Verne Spurr, PA-C      . alum & mag hydroxide-simeth (MAALOX/MYLANTA) 200-200-20 MG/5ML suspension 30 mL  30 mL  Oral Q4H PRN Verne Spurr, PA-C      . gabapentin (NEURONTIN) capsule 400 mg  400 mg Oral [id number] Randy D Readling, MD      . hydrOXYzine (ATARAX/VISTARIL) tablet 25 mg  25 mg Oral Q6H PRN Verne Spurr, PA-C   25 mg at 11/23/11 1658  . magnesium hydroxide (MILK OF MAGNESIA) suspension 30 mL  30 mL Oral Daily PRN Verne Spurr, PA-C      . nicotine (NICODERM CQ - dosed in mg/24 hours) patch 21 mg  21 mg Transdermal Daily Curlene Labrum Readling, MD   21 mg at 11/23/11 0850  . risperiDONE (RISPERDAL) tablet 2 mg  2 mg Oral QHS Alyson Kuroski-Mazzei, DO   2 mg at 11/22/11 2141  . sertraline (ZOLOFT) tablet 200 mg  200 mg Oral Lowella Curb, NP   200 mg at 11/23/11 2130  . traZODone (DESYREL) tablet 50 mg  50 mg Oral QHS Curlene Labrum Readling, MD      . DISCONTD: gabapentin (NEURONTIN) capsule 200 mg  200 mg Oral [id number] Curlene Labrum Readling, MD   200 mg at 11/23/11 1415  . DISCONTD: traZODone (DESYREL) tablet 25 mg  25 mg Oral QHS Verne Spurr, PA-C   25 mg at 11/22/11 2141   Lab Results: No results found for this or any previous visit (from the past 48 hour(s)).  Physical Findings: AIMS: Facial and Oral Movements Muscles of Facial Expression: None, normal Lips and Perioral Area: None, normal Jaw: None, normal Tongue: None, normal,Extremity Movements Upper (arms, wrists, hands, fingers): None, normal Lower (legs, knees, ankles, toes): None, normal, Trunk  Movements Neck, shoulders, hips: None, normal, Overall Severity Severity of abnormal movements (highest score from questions above): None, normal Incapacitation due to abnormal movements: None, normal Patient's awareness of abnormal movements (rate only patient's report): No Awareness, Dental Status Current problems with teeth and/or dentures?: No Does patient usually wear dentures?: No   Review of Systems:  Neurological: The patient denies any headaches today. She also denies any seizure history.  G.I.: The patient denies any constipation  or G.I. Upset today.  Musculoskeletal: The patient denies any muscle or skeletal difficulties.   Time was spent today discussing with the patient her current symptoms. The patient states that she continues to have some difficulty initiating and maintaining sleep but reports a good appetite. The patient reports moderate feelings of sadness, anhedonia and depressed mood and denies any suicidal or homicidal ideations. The patient reports moderate to severe anxiety today and denies any auditory or visual hallucinations or delusional thinking.   Treatment Plan Summary:  1. Daily contact with patient to assess and evaluate symptoms and progress in treatment.  2. Medication management  3. The patient will deny suicidal ideations or homicidal ideations for 48 hours prior to discharge and have a depression and anxiety rating of 3 or less. The patient will also deny any auditory or visual hallucinations or delusional thinking.  4. The patient will deny any symptoms of substance withdrawal at time of discharge.   Plan:  1. Will continue the medication Zoloft at 200 mgs po q am for depression and anxiety.  2. Will continue the medication Risperdal at 2 mgs po qhs for mood stabilization.  3. Will increase the medication Trazodone to 50 mgs po qhs for sleep.  4. Will increase the medication Neurontin to 400 mgs po q am, 2 pm and hs for anxiety.  5. Laboratory studies reviewed.  6. Will continue to monitor.   RANDY READLING 11/23/2011, 8:42 PM

## 2011-11-23 NOTE — Progress Notes (Signed)
Psychoeducational Group Note  Date:  11/23/2011 Time:  1100  Group Topic/Focus:  Recovery Goals:   The focus of this group is to identify appropriate goals for recovery and establish a plan to achieve them.  Participation Level:  Minimal  Participation Quality:  Appropriate  Affect:  Appropriate  Cognitive:  Alert  Insight:  Limited  Engagement in Group:  Limited  Additional Comments:  Pt was able to attend group but didn't participate fully(didn't say much).  Mort Smelser E 11/23/2011, 5:31 PM

## 2011-11-24 MED ORDER — BUPROPION HCL ER (SR) 150 MG PO TB12
150.0000 mg | ORAL_TABLET | Freq: Every day | ORAL | Status: DC
  Administered 2011-11-25 – 2011-11-30 (×6): 150 mg via ORAL
  Filled 2011-11-24 (×9): qty 1

## 2011-11-24 MED ORDER — TRAZODONE 25 MG HALF TABLET
25.0000 mg | ORAL_TABLET | Freq: Every evening | ORAL | Status: DC | PRN
  Administered 2011-11-24 – 2011-11-26 (×4): 25 mg via ORAL
  Administered 2011-11-27: 21:00:00 via ORAL
  Administered 2011-11-27 – 2011-11-29 (×4): 25 mg via ORAL
  Filled 2011-11-24 (×16): qty 1

## 2011-11-24 NOTE — Progress Notes (Signed)
Psychoeducational Group Note  Date:  11/24/2011 Time:  1100  Group Topic/Focus:  Emotional Education:   The focus of this group is to discuss what feelings/emotions are, and how they are experienced.  Participation Level:  Active  Participation Quality:  Appropriate and Sharing  Affect:  Appropriate  Cognitive:  Appropriate  Insight:  Good  Engagement in Group:  Good  Additional Comments:  none Nilson Tabora M 11/24/2011, 1:19 PM

## 2011-11-24 NOTE — Therapy (Signed)
Psychoeducational Group Note  Date:  11/25/2011 Time:  2030  Group Topic/Focus:  Wrap-Up Group:   The focus of this group is to help patients review their daily goal of treatment and discuss progress on daily workbooks.  Participation Level:  Active  Participation Quality:  Attentive  Affect:  Appropriate  Cognitive:  Appropriate  Insight:  Good  Engagement in Group:  Good  Additional Comments:  Patient attended and participated in wrap-up group this evening.  Atif Chapple, Newton Pigg 11/25/2011, 12:07 AM

## 2011-11-24 NOTE — Progress Notes (Signed)
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  Generalized Anxiety Disorder.   The patient was seen today and reports the following:   ADL's: Intact.  Sleep: The patient reports to sleeping very well last night but feels oversedated this morning.  Appetite: The patient reports a good appetite this morning.   Mild>(1-10) >Severe  Hopelessness (1-10): 5  Depression (1-10): 6  Anxiety (1-10): 7   Suicidal Ideation: The patient denies any suicidal ideations today.  Plan: No  Intent: No  Means: No   Homicidal Ideation: The patient denies any homicidal ideations today.  Plan: No  Intent: No.  Means: No   General Appearance/Behavior: The patient remains friendly and cooperative today with this provider.  Eye Contact: Good.  Speech: Appropriate in rate and volume with no pressuring noted today.  Motor Behavior: wnl.  Level of Consciousness: Alert and Oriented x 3.  Mental Status: Alert and Oriented x 3.  Mood: Appears moderately depressed today.  Affect: Appears moderately constricted.  Anxiety Level: Moderate anxiety reported today.  Thought Process: wnl.  Thought Content: The patient denies any auditory or visual hallucinations today as well as any delusional thinking.  Perception: wnl.  Judgment: Good.  Insight: Good.  Cognition: Oriented to person, place and time.  Sleep:  Number of Hours: 6.75    Vital Signs:Blood pressure 104/67, pulse 88, temperature 98 F (36.7 C), temperature source Oral, resp. rate 14, height 5' (1.524 m), weight 57.607 kg (127 lb), last menstrual period 11/15/2011.  Current Medications: Current Facility-Administered Medications  Medication Dose Route Frequency Provider Last Rate Last Dose  . acetaminophen (TYLENOL) tablet 650 mg  650 mg Oral Q6H PRN Verne Spurr, PA-C      . alum & mag hydroxide-simeth (MAALOX/MYLANTA) 200-200-20 MG/5ML suspension 30 mL  30 mL Oral Q4H  PRN Verne Spurr, PA-C      . buPROPion [facility] SR) 12 hr tablet 150 mg  150 mg Oral Q breakfast Curlene Labrum Aralyn Nowak, MD      . gabapentin (NEURONTIN) capsule 400 mg  400 mg Oral [id number] Curlene Labrum Loyed Wilmes, MD   400 mg at 11/24/11 1348  . hydrOXYzine (ATARAX/VISTARIL) tablet 25 mg  25 mg Oral Q6H PRN Verne Spurr, PA-C   25 mg at 11/24/11 1301  . magnesium hydroxide (MILK OF MAGNESIA) suspension 30 mL  30 mL Oral Daily PRN Verne Spurr, PA-C      . nicotine (NICODERM CQ - dosed in mg/24 hours) patch 21 mg  21 mg Transdermal Daily Curlene Labrum Kadar Chance, MD   21 mg at 11/24/11 0935  . risperiDONE (RISPERDAL) tablet 2 mg  2 mg Oral QHS Alyson Kuroski-Mazzei, DO   2 mg at 11/23/11 2149  . sertraline (ZOLOFT) tablet 200 mg  200 mg Oral Lowella Curb, NP   200 mg at 11/24/11 0654  . traZODone (DESYREL) tablet 25 mg  25 mg Oral QHS,MR X 1 Georjean Toya D Cutberto Winfree, MD      . DISCONTD: gabapentin (NEURONTIN) capsule 200 mg  200 mg Oral [id number] Curlene Labrum Giovan Pinsky, MD   200 mg at 11/23/11 1415  . DISCONTD: traZODone (DESYREL) tablet 25 mg  25 mg Oral QHS Verne Spurr, PA-C   25 mg at 11/22/11 2141  . DISCONTD: traZODone (DESYREL) tablet 50 mg  50 mg Oral QHS Ronny Bacon, MD   50 mg at 11/23/11 2149   Lab Results: No results found  for this or any previous visit (from the past 48 hour(s)).  Physical Findings: AIMS: Facial and Oral Movements Muscles of Facial Expression: None, normal Lips and Perioral Area: None, normal Jaw: None, normal Tongue: None, normal,Extremity Movements Upper (arms, wrists, hands, fingers): None, normal Lower (legs, knees, ankles, toes): None, normal, Trunk Movements Neck, shoulders, hips: None, normal, Overall Severity Severity of abnormal movements (highest score from questions above): None, normal Incapacitation due to abnormal movements: None, normal Patient's awareness of abnormal movements (rate only patient's report): No Awareness, Dental Status Current problems  with teeth and/or dentures?: No Does patient usually wear dentures?: No   Review of Systems:  Neurological: The patient denies any headaches today. She also denies any seizure history.  G.I.: The patient denies any constipation or G.I. Upset today.  Musculoskeletal: The patient denies any muscle or skeletal difficulties.   Time was spent today discussing with the patient her current symptoms. The patient states that she slept well last night but feels oversedated this morning.  He reports a good appetite and reports moderate feelings of sadness, anhedonia and depressed mood and denies any suicidal or homicidal ideations. The patient reports moderate anxiety today and denies any auditory or visual hallucinations or delusional thinking.   Treatment Plan Summary:  1. Daily contact with patient to assess and evaluate symptoms and progress in treatment.  2. Medication management  3. The patient will deny suicidal ideations or homicidal ideations for 48 hours prior to discharge and have a depression and anxiety rating of 3 or less. The patient will also deny any auditory or visual hallucinations or delusional thinking.  4. The patient will deny any symptoms of substance withdrawal at time of discharge.   Plan:  1. Will continue the medication Zoloft at 200 mgs po q am for depression and anxiety.  2. Will start the medication Wellbutrin SR at 150 mgs po q am to also address her depressive symptoms. 3. Will continue the medication Risperdal at 2 mgs po qhs for mood stabilization.  4. Will decrease the medication Trazodone to 25 mgs po qhs for sleep.  This was decreased due to oversedation this morning. 5. Will continue the medication Neurontin at 400 mgs po q am, 2 pm and hs for anxiety.  6. Laboratory studies reviewed.  7. Will continue to monitor.   Tenya Araque 11/24/2011, 4:00 PM

## 2011-11-24 NOTE — Progress Notes (Signed)
Pt rates her depression as a 6 on 1-10 scale with 10 being the most depressed. Pt has a sad/blunted affect and has been sleepy throughout the day. Supported pt to discuss feelings. Encouraged pt to go to cafeteria and attend groups. Pt reports that being with others is difficult and cafeteria noises bother her. Pt reports that she is working on not isolating and making herself be around people. Safety maintained on unit.

## 2011-11-24 NOTE — Progress Notes (Signed)
[facility] Group Notes:  (Counselor/Nursing/MHT/Case Management/Adjunct)  717/2013 2:00 PM  Type of Therapy:  [employer] Support  Participation Level:  Minimal  Participation Quality:  Attentive   Affect:  Blunted  Cognitive:  Alert and Oriented  Insight:  Poor  Engagement in Group:  Limited  Engagement in Therapy:  Limited  Modes of Intervention:  Clarification, Education, Socialization and Support  Summary of Progress/Problems:  Pt participated in this psycho/educational session by listening attentively. Therapist introduced the representative from the [employer].  He explained to the group that he was here to tell his story and inform them of the free services available at the [employer].  The speaker told his story and explained the progression of his Schizoaffective Disorder and Substance Abuse as well as the course of his recovery.  He entertained questions and handed out information cards.  Pt did not ask any questions. Therapist encouraged patients to take advantage of these free services that would be an excellent addition to their support system.     Marni Griffon C 11/24/2011, 2:00 PM

## 2011-11-24 NOTE — Progress Notes (Signed)
Date: 11/24/2011  Time: 0930   Group Topic/Focus: The focus of this group is on enhancing the patient's understanding of leisure, barriers to leisure, and the importance of engaging in positive leisure activities upon discharge for improved total health.   Participation Level:  Minimal  Participation Quality:  Attentive  Affect:  Irritable  Cognitive:  Alert   Additional Comments: Patient missed much of group because she was meeting with the MD. Patient became very agitated when RT called her "Ms Roshan," correcting RT, but said little else during group.  Aleynah Rocchio  11/24/2011 11:37 AM

## 2011-11-25 NOTE — Progress Notes (Signed)
Currently resting quietly in bed with eyes closed. Respirations are even and unlabored. No acute distress noted. Safety has been maintained with Q15 minute observation. Will continue current POC. 

## 2011-11-25 NOTE — Progress Notes (Signed)
Psychoeducational Group Note  Date:  11/25/2011 Time:  1100  Group Topic/Focus:  Self Esteem Action Plan:   The focus of this group is to help patients create a plan to continue to build self-esteem after discharge.  Participation Level:  Did Not Attend  Participation Quality:    Affect:    Cognitive:    Insight:    Engagement in Group:    Additional Comments:  Pt did not attend, pt would not get out of bed. Pt refused.  Isla Pence M 11/25/2011, 3:36 PM

## 2011-11-25 NOTE — Progress Notes (Signed)
[facility] MD Progress Note  11/25/2011 5:19 PM  Diagnosis:  Axis I: Major Depressive Disorder - Recurrent - Moderate.  Generalized Anxiety Disorder.   The patient was seen today and reports the following:   ADL's: Intact.  Sleep: The patient reports to sleeping very well last night but again feels oversedated this morning.  Appetite: The patient reports a good appetite this morning.   Mild>(1-10) >Severe  Hopelessness (1-10): 6  Depression (1-10): 7  Anxiety (1-10): 7   Suicidal Ideation: The patient denies any suicidal ideations today.  Plan: No  Intent: No  Means: No    Homicidal Ideation: The patient denies any homicidal ideations today.  Plan: No  Intent: No.  Means: No   General Appearance/Behavior: The patient remains friendly and cooperative today with this provider.  Eye Contact: Good.  Speech: Appropriate in rate and volume with no pressuring noted today.  Motor Behavior: wnl.  Level of Consciousness: Alert and Oriented x 3.  Mental Status: Alert and Oriented x 3.  Mood: Appears moderately depressed today.  Affect: Appears moderately constricted.  Anxiety Level: Moderate anxiety reported today.  Thought Process: wnl.  Thought Content: The patient denies any auditory or visual hallucinations today as well as any delusional thinking.  Perception: wnl.  Judgment: Good.  Insight: Good.  Cognition: Oriented to person, place and time.  Sleep:  Number of Hours: 6.75    Vital Signs:Blood pressure 83/60, pulse 90, temperature 98.4 F (36.9 C), temperature source Oral, resp. rate 16, height 5' (1.524 m), weight 57.607 kg (127 lb), last menstrual period 11/15/2011.  Current Medications: Current Facility-Administered Medications  Medication Dose Route Frequency Provider Last Rate Last Dose  . acetaminophen (TYLENOL) tablet 650 mg  650 mg Oral Q6H PRN Verne Spurr, PA-C   650 mg at 11/24/11 1957  . alum & mag hydroxide-simeth (MAALOX/MYLANTA) 200-200-20 MG/5ML suspension  30 mL  30 mL Oral Q4H PRN Verne Spurr, PA-C      . buPROPion [facility] SR) 12 hr tablet 150 mg  150 mg Oral Q breakfast Curlene Labrum Aiken Withem, MD   150 mg at 11/25/11 0837  . gabapentin (NEURONTIN) capsule 400 mg  400 mg Oral [id number] Curlene Labrum Tylah Mancillas, MD   400 mg at 11/25/11 1305  . hydrOXYzine (ATARAX/VISTARIL) tablet 25 mg  25 mg Oral Q6H PRN Verne Spurr, PA-C   25 mg at 11/25/11 1305  . magnesium hydroxide (MILK OF MAGNESIA) suspension 30 mL  30 mL Oral Daily PRN Verne Spurr, PA-C      . nicotine (NICODERM CQ - dosed in mg/24 hours) patch 21 mg  21 mg Transdermal Daily Curlene Labrum Atzel Mccambridge, MD   21 mg at 11/25/11 3086  . risperiDONE (RISPERDAL) tablet 2 mg  2 mg Oral QHS Alyson Kuroski-Mazzei, DO   2 mg at 11/24/11 2141  . sertraline (ZOLOFT) tablet 200 mg  200 mg Oral Lowella Curb, NP   200 mg at 11/25/11 0651  . traZODone (DESYREL) tablet 25 mg  25 mg Oral QHS,MR X 1 Curlene Labrum Fredie Majano, MD   25 mg at 11/24/11 2141   Lab Results: No results found for this or any previous visit (from the past 48 hour(s)).  Physical Findings: AIMS: Facial and Oral Movements Muscles of Facial Expression: None, normal Lips and Perioral Area: None, normal Jaw: None, normal Tongue: None, normal,Extremity Movements Upper (arms, wrists, hands, fingers): None, normal Lower (legs, knees, ankles, toes): None, normal, Trunk Movements Neck, shoulders, hips: None, normal, Overall Severity Severity  of abnormal movements (highest score from questions above): None, normal Incapacitation due to abnormal movements: None, normal Patient's awareness of abnormal movements (rate only patient's report): No Awareness, Dental Status Current problems with teeth and/or dentures?: No Does patient usually wear dentures?: No   Review of Systems:  Neurological: The patient denies any headaches today. She also denies any seizure history.  G.I.: The patient denies any constipation or G.I. Upset today.  Musculoskeletal: The  patient denies any muscle or skeletal difficulties.   Time was spent today discussing with the patient her current symptoms. The patient states that she slept well last night but again feels oversedated this morning. She reports a good appetite and reports moderate feelings of sadness, anhedonia and depressed mood and denies any suicidal or homicidal ideations. The patient reports moderate anxiety today and denies any auditory or visual hallucinations or delusional thinking.   It was discussed with the patient that it would be best for her to remain in the hospital until her depressive and anxiety symptoms improve and she agrees.  Treatment Plan Summary:  1. Daily contact with patient to assess and evaluate symptoms and progress in treatment.  2. Medication management  3. The patient will deny suicidal ideations or homicidal ideations for 48 hours prior to discharge and have a depression and anxiety rating of 3 or less. The patient will also deny any auditory or visual hallucinations or delusional thinking.  4. The patient will deny any symptoms of substance withdrawal at time of discharge.   Plan:  1. Will continue the medication Zoloft at 200 mgs po q am for depression and anxiety.  2. Will continue the medication Wellbutrin SR at 150 mgs po q am to also address her depressive symptoms.  3. Will continue the medication Risperdal at 2 mgs po qhs for mood stabilization.  4. Will continue the medication Trazodone at 25 mgs po qhs for sleep.  5. Will continue the medication Neurontin at 400 mgs po q am, 2 pm and hs for anxiety.  6. Laboratory studies reviewed.  7. Will continue to monitor.   Adia Crammer 11/25/2011, 5:19 PM

## 2011-11-25 NOTE — Progress Notes (Signed)
[facility] Group Notes:  (Counselor/Nursing/MHT/Case Management/Adjunct)  11/25/2011  10:30  AM  Type of Therapy: Group Therapy   Participation Level: Minimal   Participation Quality: Limited  Affect: Blunted, Depresssed  Cognitive: oriented, alert   Insight: Poor  Engagement in Group: Limited  Modes of Intervention: Clarification, Education, Problem-solving, Socialization,  Encouragement and Support, Activity   Summary of Progress/Problems: Pt participated in group by listening attentively and self disclosing.  After a brief check-in, therapist introduced the topic of balance and lack of balance in the lives of patients.  Therapist encouraged patients to identify areas in their lives that are out of balance, and give personal examples.  Therapist guided patients in problem solving ways to get these areas back in balance.  Therapist prompted patients to to explore ways to keep a balance, including setting appropriate boundaries.   Therapist encouraged patients to explore ways to assertively make their needs known to significant others in their lives. Pt was minimally engaged in the process.  Patient actively participated in the Positive Affirmation Activity by giving and receiving positive affirmations.  Pt was observed to be smiling and reported an elevation in mood as a result of this exercise. Therapist offered support and encouragement.  Some Progress noted.  Intervention effective.         Marni Griffon C 11/25/2011  10:30 AM

## 2011-11-25 NOTE — Progress Notes (Signed)
[facility] Group Notes:  (Counselor/Nursing/MHT/Case Management/Adjunct)  11/25/2011  2:15 PM  Type of Therapy: Group Therapy   Participation Level: Minimal   Participation Quality: Minimal  Affect: Blunted   Cognitive: oriented, alert   Insight: minimal  Engagement in Group: Limited  Modes of Intervention: Clarification, Education, Problem-solving, Socialization, Activity, Encouragement and Support   Summary of Progress/Problems: Pt actively participated in group by listening attentively and self disclosing.  Therapist addressed the topic of Creativity and Humor.  Therapist asked patients to verbalize a time in their lives that they were very happy.  Therapist encouraged patients to remember happy times when they are feeling stressed. Pt identified being very happy when her daughter first called her mommie.  Therapist emphasized that maintaining a good sense of humor and laughter are important to maintaining a balanced lifestyle. Therapist praised patients for being creative and asked them to tell as story as evidence of this.  Therapist began a story and each patient added to the story for several rounds.     Therapist offered support and encouragement.  Some progress noted.  Intervention effective.         Marni Griffon C 11/25/2011, 2:15 PM

## 2011-11-25 NOTE — Progress Notes (Signed)
Patient ID: Jennifer Flores, female   DOB: [birth year], 46 y.o.   MRN: [id number] D: Pt in room folding clothes, makes brief eye contact, sad affect, denies SHI, no pain A: Pt. Reports depression at 6 of 10, writer reviews meds and administration times, q61min safety checks R: Pt. Verbalizes understanding Warden/ranger gave, pt. Remains safe on the unit.

## 2011-11-25 NOTE — Progress Notes (Signed)
  D) Patient pleasant and cooperative upon my assessment. Patient states slept "okay," and  appetite is "good." Patient refused/was unable to complete patient self inventory. Patient denies SI/HI, denies A/V hallucinations.   A) Patient offered support and encouragement, patient encouraged to discuss feelings/concerns with staff. Patient verbalized understanding. Patient monitored Q15 minutes for safety. Patient met with MD to discuss today's goals and plan of care.  R) Patient active on unit, attending groups in day room and meals in dining room.  Patient verbalizes anxiety throughout this shift, PRN medications given. Patient taking medications as ordered. Will continue to monitor.

## 2011-11-26 NOTE — Progress Notes (Signed)
11/26/2011         Time: 0930       Group Topic/Focus: The focus of the group is on enhancing the patients' ability to cope with stressors by understanding what coping is, why it is important, the negative effects of stress and developing healthier coping skills. Patients practice [place] and discuss how exercise can be used as a healthy coping strategy.  Participation Level: Minimal  Participation Quality: Resistant  Affect: Irritable  Cognitive: Oriented  Additional Comments: Prior to group case manager reported to RT that patient was feeling "wobbly." RT provided patient with modifications but patient reported she didn't feel like talking or participating. Patient sat quietly throughout group.  Mikele Sifuentes 11/26/2011 11:55 AM

## 2011-11-26 NOTE — Progress Notes (Addendum)
Patient ID: Jennifer Flores, female   DOB: Sep 30, 1965, 46 y.o.   MRN: [id number] [facility] MD Progress Note  11/26/2011 2:07 PM  Diagnosis:  Axis I: Major Depressive Disorder - Recurrent - Moderate.  Generalized Anxiety Disorder.   The patient was seen today and reports the following:   ADL's: Intact.  Sleep: The patient reports to sleeping very well last night but reports blurred vision this morning.  Appetite: The patient reports a good appetite this morning.   Mild>(1-10) >Severe  Hopelessness (1-10): 6  Depression (1-10): 7  Anxiety (1-10): 7   Suicidal Ideation: The patient denies any suicidal ideations today.  Plan: No  Intent: No  Means: No    Homicidal Ideation: The patient denies any homicidal ideations today.  Plan: No  Intent: No.  Means: No   General Appearance/Behavior: The patient remains friendly and cooperative today with this provider.  Eye Contact: Good.  Speech:  She has a spontaneous speech  And sequential logic, relating facts. Motor Behavior: wnl.  Level of Consciousness: Alert and Oriented x 3.  Mental Status: Alert and Oriented x 3.  Mood: Appears moderately improved today.  Affect: Pt smiles Anxiety Level: Low level of  anxiety reported today.  Thought Process: wnl.  Thought Content: The patient denies any auditory or visual hallucinations today as well as any delusional thinking.  Perception: wnl.  Judgment: Good.  Insight: Good.  Cognition: Oriented to person, place and time.  Sleep:  Number of Hours: 6.75    Vital Signs:Blood pressure 100/67, pulse 106, temperature 98.3 F (36.8 C), temperature source Oral, resp. rate 16, height 5' (1.524 m), weight 57.607 kg (127 lb), last menstrual period 11/15/2011.  Current Medications: Current Facility-Administered Medications  Medication Dose Route Frequency Provider Last Rate Last Dose  . acetaminophen (TYLENOL) tablet 650 mg  650 mg Oral Q6H PRN Verne Spurr, PA-C   650 mg at 11/24/11 1957  . alum  & mag hydroxide-simeth (MAALOX/MYLANTA) 200-200-20 MG/5ML suspension 30 mL  30 mL Oral Q4H PRN Verne Spurr, PA-C      . buPROPion [facility] SR) 12 hr tablet 150 mg  150 mg Oral Q breakfast Curlene Labrum Readling, MD   150 mg at 11/26/11 0811  . gabapentin (NEURONTIN) capsule 400 mg  400 mg Oral [id number] Curlene Labrum Readling, MD   400 mg at 11/26/11 0811  . hydrOXYzine (ATARAX/VISTARIL) tablet 25 mg  25 mg Oral Q6H PRN Verne Spurr, PA-C   25 mg at 11/26/11 0811  . magnesium hydroxide (MILK OF MAGNESIA) suspension 30 mL  30 mL Oral Daily PRN Verne Spurr, PA-C      . nicotine (NICODERM CQ - dosed in mg/24 hours) patch 21 mg  21 mg Transdermal Daily Curlene Labrum Readling, MD   21 mg at 11/26/11 0811  . risperiDONE (RISPERDAL) tablet 2 mg  2 mg Oral QHS Alyson Kuroski-Mazzei, DO   2 mg at 11/25/11 2150  . sertraline (ZOLOFT) tablet 200 mg  200 mg Oral Lowella Curb, NP   200 mg at 11/26/11 0659  . traZODone (DESYREL) tablet 25 mg  25 mg Oral QHS,MR X 1 Curlene Labrum Readling, MD   25 mg at 11/25/11 2252   Lab Results: No results found for this or any previous visit (from the past 48 hour(s)).  Physical Findings: AIMS: Facial and Oral Movements Muscles of Facial Expression: None, normal Lips and Perioral Area: None, normal Jaw: None, normal Tongue: None, normal,Extremity Movements Upper (arms, wrists, hands, fingers): None,  normal Lower (legs, knees, ankles, toes): None, normal, Trunk Movements Neck, shoulders, hips: None, normal, Overall Severity Severity of abnormal movements (highest score from questions above): None, normal Incapacitation due to abnormal movements: None, normal Patient's awareness of abnormal movements (rate only patient's report): No Awareness, Dental Status Current problems with teeth and/or dentures?: No Does patient usually wear dentures?: No   Review of Systems:  Neurological: The patient denies any headaches today. She also denies any seizure history.  G.I.: The patient  denies any constipation or G.I. Upset today.  Musculoskeletal: The patient denies any muscle or skeletal difficulties.   Time was spent today discussing with the patient her current symptoms. Pt recognizes that she stopped taking her medications because she was feeling better.  After that, she progressively began to feel worse and depressed.  She has learned that she needs to keep taking her medication.  She reports a good appetite and reports moderate feelings of sadness, anhedonia and depressed mood and denies any suicidal or homicidal ideations. The patient reports moderate anxiety today and denies any auditory or visual hallucinations or delusional thinking.  It was discussed with the patient that it would be best for her to remain in the hospital until her depressive and anxiety symptoms improve and she agrees.  Treatment Plan Summary:  1. Daily contact with patient to assess and evaluate symptoms and progress in treatment.  2. Medication management  3. The patient will deny suicidal ideations or homicidal ideations for 48 hours prior to discharge and have a depression and anxiety rating of 3 or less. The patient will also deny any auditory or visual hallucinations or delusional thinking.  4. The patient will deny any symptoms of substance withdrawal at time of discharge.  5.  Pt reported blurred vision this am but says vision is clear this afternoon.  Plan:  1. Will continue the medication Zoloft at 200 mgs po q am for depression and anxiety.  2. Will continue the medication Wellbutrin SR at 150 mgs po q am to also address her depressive symptoms.  3. Will continue the medication Risperdal at 2 mgs po qhs for mood stabilization.  4. Will continue the medication Trazodone at 25 mgs po qhs for sleep.  5. Will continue the medication Neurontin at 400 mgs po q am, 2 pm and hs for anxiety.  6. Laboratory studies reviewed.  7. Will continue to monitor.   Jennifer Flores 11/26/2011, 2:07 PM

## 2011-11-26 NOTE — Progress Notes (Signed)
Psychoeducational Group Note  Date:  11/26/2011 Time:  1100  Group Topic/Focus:  Relapse Prevention Planning:   The focus of this group is to define relapse and discuss the need for planning to combat relapse.  Participation Level:  Did Not Attend  Participation Quality:    Affect:    Cognitive:    Insight:    Engagement in Group:    Additional Comments: pt didn't attend group, pt has been in an ill mood today.  Isla Pence M 11/26/2011, 1:54 PM

## 2011-11-26 NOTE — Progress Notes (Signed)
Patient ID: Jennifer Flores, female   DOB: January 31, 1966, 46 y.o.   MRN: [id number]  Pt currently asleep; no s/s of distress noted at this time.

## 2011-11-26 NOTE — Progress Notes (Addendum)
Writer met with patient who continues to endorses depression and anxiety at six.  She reports blurred vision and off balance.  Patient reports she wants to be stable on her feet prior to discharging from the hospital.  Per State Regulation 482.30 This chart was reviewed for medical necessity with respect to the patient's  Admission/Duration of Stay  [facility], LCSW @7 /19/2013    Next Review Date  11/29/11

## 2011-11-26 NOTE — Progress Notes (Signed)
[facility] Group Notes:  (Counselor/Nursing/MHT/Case Management/Adjunct)  11/26/2011  2:30  PM  Type of Therapy: Group Therapy   Participation Level:  Did Not attend    Christen Butter 11/26/2011  2:30 PM

## 2011-11-26 NOTE — Tx Team (Signed)
Interdisciplinary Treatment Plan Update (Adult)  Date:  11/26/2011  Time Reviewed:  9:40 AM   Progress in Treatment: Attending groups:   Yes   Participating in groups:  Yes Taking medication as prescribed:  Yes Tolerating medication:  Yes Family/Significant othe contact made:  Patient understands diagnosis:  Yes Discussing patient identified problems/goals with staff: Yes Medical problems stabilized or resolved: Yes Denies suicidal/homicidal ideation:Yes Issues/concerns per patient self-inventory: Feeling wobbly, eyes blurring Other:  New problem(s) identified:  None  Reason for Continuation of Hospitalization: Anxiety Depression Medication stabilization  Interventions implemented related to continuation of hospitalization:  Medication stabilization; groups to develop coping skills  Medication Management; safety checks q 15 mins  Additional comments:  Estimated length of stay: 2-4 days  Discharge Plan:  Home with outpatient follow up  New goal(s):  Review of initial/current patient goals per problem list:    1.  Goal(s): Reduce depression/anxiety (Patient rating both at six today)   Met:  No  Target date: d/c  As evidenced by: Patient will rate symptoms at four or below    2.  Goal (s): .stabilize on meds   Met:  No  Target date: d/c  As evidenced by: Patient will report being stable on medications - symptoms have decreased    3.  Goal(s):  Decrease blurriness/feelings of being off balance  Met:  No  Target date: d/c  As evidenced by:  Patient will report no longer having symptoms/or symptoms have decreased substantially   4.  Goal(s):  Refer for outpatient follow up    Met:  Yes  Target date:  D/c  As evidenced by:  Follow up appointment scheduled with [facility]  On 11/30/11  Attendees: Patient:     Other:  Ambrose Mantle, LCSW   11/26/2011 9:46 AM  Physician:         Lucianne Muss, MD 11/26/2011 9:40 AM   Nursing:    11/26/2011 9:40 AM     CaseManager:  Juline Patch, LCSW 11/26/2011 9:40 AM   Counselor:  Marni Griffon, LCAS 11/26/2011 9:40 AM   Other:

## 2011-11-26 NOTE — Progress Notes (Signed)
D: Pt denies SI/HI/AV. Pt is pleasant and cooperative.Pt is a little delusion and now thinks she could be pregnant and would like a pregnancy test. Pt rates depression at a 5, anxiety at a 7, and Helplessness/hopelessness at a 5.  A: Pt was offered support and encouragement. Pt was given scheduled medications. Pt was encourage to attend groups. Pt was also told that they already have a urine sample from her and she is note pregnant. Q 15 minute checks were done for safety.  R:Pt attends some groups and interacts well with peers and staff. Pt is taking medication.Pt receptive to treatment and safety maintained on unit.

## 2011-11-27 NOTE — Progress Notes (Signed)
Psychoeducational Group Note  Date:  11/27/2011 Time:  2000  Group Topic/Focus:  Wrap-Up Group:   The focus of this group is to help patients review their daily goal of treatment and discuss progress on daily workbooks.  Participation Level:  Active  Participation Quality:  Appropriate  Affect:  Appropriate  Cognitive:  Appropriate  Insight:  Good  Engagement in Group:  Good  Additional Comments:  Patient attended and participated in wrap-up group this evening.   [name] A 11/27/2011, 2:37 AM

## 2011-11-27 NOTE — Progress Notes (Signed)
Patient ID: Jennifer Flores, female   DOB: 07-29-1965, 46 y.o.   MRN: [id number] 11-27-11 @ 1230 nursing shift note: D: pt was in bed most of the am. A: staff attempted to get the pt engaged and involved in groups and in the milieu. R: after lunch she became more visible in the milieu. She still is disheveled and her interactions are minimal. Staff will continue to work with this patient.

## 2011-11-27 NOTE — Progress Notes (Signed)
Psychoeducational Group Note  Date:  11/27/2011 Time:  1515  Group Topic/Focus:  Healthy Communication:   The focus of this group is to discuss communication, barriers to communication, as well as healthy ways to communicate with others.  Participation Level:  Minimal  Participation Quality:  Appropriate  Affect:  Appropriate  Cognitive:  Appropriate  Insight:  Good  Engagement in Group:  Good  Additional Comments: pt participated and processed in group.  Marquis Lunch, Eran Mistry 11/27/2011, 6:57 PM

## 2011-11-27 NOTE — Progress Notes (Signed)
Patient ID: Jennifer Flores, female   DOB: Nov 21, 1965, 46 y.o.   MRN: [id number] [facility] MD Progress Note  11/27/2011 6:33 PM  Diagnosis:  Axis I: Major Depressive Disorder - Recurrent - Moderate.  Generalized Anxiety Disorder.   The patient was seen today and reports the following:   ADL's: Intact.  Sleep: The patient reports to sleeping very well last night but reports blurred vision this morning.  Appetite: The patient reports a good appetite this morning.   Mild>(1-10) >Severe  Hopelessness (1-10): 6  Depression (1-10): 7  Anxiety (1-10): 6   Suicidal Ideation: The patient denies any suicidal ideations today.  Plan: No  Intent: No  Means: No    Homicidal Ideation: The patient denies any homicidal ideations today.  Plan: No  Intent: No.  Means: No   General Appearance/Behavior: The patient remains friendly and cooperative today with this provider.  Eye Contact: Good.  Speech:  She has a spontaneous speech  And sequential logic, relating facts. Motor Behavior: wnl.  Level of Consciousness: Alert and Oriented x 3.  Mental Status: Alert and Oriented x 3.  Mood: Appears moderately improved today.  Affect: Pt smiles Anxiety Level: Low level of  anxiety reported today.  Thought Process: disorganized at times. Thought Content: The patient denies any auditory or visual hallucinations today as well as any delusional thinking.  Perception: wnl.  Judgment: poor.  Insight: poor Cognition: Oriented to person, place and time.  Sleep:  Number of Hours: 6.75    Vital Signs:Blood pressure 101/65, pulse 98, temperature 99.4 F (37.4 C), temperature source Oral, resp. rate 18, height 5' (1.524 m), weight 57.607 kg (127 lb), last menstrual period 11/15/2011.  Current Medications: Current Facility-Administered Medications  Medication Dose Route Frequency Provider Last Rate Last Dose  . acetaminophen (TYLENOL) tablet 650 mg  650 mg Oral Q6H PRN Verne Spurr, PA-C   650 mg at 11/24/11  1957  . alum & mag hydroxide-simeth (MAALOX/MYLANTA) 200-200-20 MG/5ML suspension 30 mL  30 mL Oral Q4H PRN Verne Spurr, PA-C   30 mL at 11/26/11 2223  . buPROPion [facility] SR) 12 hr tablet 150 mg  150 mg Oral Q breakfast Curlene Labrum Readling, MD   150 mg at 11/27/11 1478  . gabapentin (NEURONTIN) capsule 400 mg  400 mg Oral [id number] Curlene Labrum Readling, MD   400 mg at 11/27/11 1406  . hydrOXYzine (ATARAX/VISTARIL) tablet 25 mg  25 mg Oral Q6H PRN Verne Spurr, PA-C   25 mg at 11/27/11 1402  . magnesium hydroxide (MILK OF MAGNESIA) suspension 30 mL  30 mL Oral Daily PRN Verne Spurr, PA-C      . nicotine (NICODERM CQ - dosed in mg/24 hours) patch 21 mg  21 mg Transdermal Daily Curlene Labrum Readling, MD   21 mg at 11/27/11 0924  . risperiDONE (RISPERDAL) tablet 2 mg  2 mg Oral QHS Alyson Kuroski-Mazzei, DO   2 mg at 11/26/11 2201  . sertraline (ZOLOFT) tablet 200 mg  200 mg Oral Lowella Curb, NP   200 mg at 11/27/11 0556  . traZODone (DESYREL) tablet 25 mg  25 mg Oral QHS,MR X 1 Curlene Labrum Readling, MD   25 mg at 11/26/11 2202   Lab Results: No results found for this or any previous visit (from the past 48 hour(s)).  Physical Findings: AIMS: Facial and Oral Movements Muscles of Facial Expression: None, normal Lips and Perioral Area: None, normal Jaw: None, normal Tongue: None, normal,Extremity Movements Upper (arms, wrists,  hands, fingers): None, normal Lower (legs, knees, ankles, toes): None, normal, Trunk Movements Neck, shoulders, hips: None, normal, Overall Severity Severity of abnormal movements (highest score from questions above): None, normal Incapacitation due to abnormal movements: None, normal Patient's awareness of abnormal movements (rate only patient's report): No Awareness, Dental Status Current problems with teeth and/or dentures?: No Does patient usually wear dentures?: No   Review of Systems:  Neurological: The patient denies any headaches today. She also denies any  seizure history.  G.I.: The patient denies any constipation or G.I. Upset today.  Musculoskeletal: The patient denies any muscle or skeletal difficulties.   Time was spent today discussing with the patient her current symptoms.   She reports a good appetite and reports moderate feelings of sadness, anhedonia and depressed mood and denies any suicidal or homicidal ideations. The patient denies any auditory or visual hallucinations or delusional thinking.  Willing to stay here and get treatment now. No new acute problems. Treatment Plan Summary:  1. Daily contact with patient to assess and evaluate symptoms and progress in treatment.  2. Medication management  3. The patient will deny suicidal ideations or homicidal ideations for 48 hours prior to discharge and have a depression and anxiety rating of 3 or less. The patient will also deny any auditory or visual hallucinations or delusional thinking.  4. The patient will deny any symptoms of substance withdrawal at time of discharge.  5.  Pt reported blurred vision this am but says vision is clear this afternoon.  Plan:  1. Will continue the medication Zoloft at 200 mgs po q am for depression and anxiety.  2. Will continue the medication Wellbutrin SR at 150 mgs po q am to also address her depressive symptoms.  3. Will continue the medication Risperdal at 2 mgs po qhs for mood stabilization.  4. Will continue the medication Trazodone at 25 mgs po qhs for sleep.  5. Will continue the medication Neurontin at 400 mgs po q am, 2 pm and hs for anxiety.  6. Laboratory studies reviewed.  7. Will continue to monitor.   [name] 11/27/2011, 6:33 PM

## 2011-11-27 NOTE — Progress Notes (Signed)
Patient ID: Jennifer Flores, female   DOB: August 20, 1965, 46 y.o.   MRN: [id number]   [facility] Group Notes:  (Counselor/Nursing/MHT/Case Management/Adjunct)  11/27/2011 11 AM  Type of Therapy:  Aftercare Planning, Group Therapy, Dance/Movement Therapy   Participation Level:  Did Not Attend   [name] 11/27/2011. 1:42 PM

## 2011-11-27 NOTE — Progress Notes (Signed)
Psychoeducational Group Note  Date:  11/27/2011 Time: 1000  Group Topic/Focus:  Identifying Needs:   The focus of this group is to help patients identify their personal needs that have been historically problematic and identify healthy behaviors to address their needs.  Participation Level:  Did Not Attend  Participation Quality:Affect: Cognitive:  Insight: Engagement in Group:Additional Comments:    Jennifer Flores 11/27/2011, 10:00 AM

## 2011-11-27 NOTE — Progress Notes (Signed)
Patient ID: ALEXANDRYA CHIM, female   DOB: 1966/01/12, 46 y.o.   MRN: [id number]  Problem: Major Depressive disorder, Anxiety, Bipolar.  D: Pt out in milieu but with little peer interaction. Pt endorses depression.  A: Monitor Q 15 minutes for safety, encourage staff/peer interaction and group participation. Administer medications as ordered by MD.  R: Pt compliant with HS medications and participated in group session. Pt denies SI or plans to harm herself, but continues to endorse depression. No inappropriate behaviors noted during shift.

## 2011-11-27 NOTE — Progress Notes (Signed)
[facility] Group Notes:  (Counselor/Nursing/MHT/Case Management/Adjunct)  11/27/2011 10:35 PM  Type of Therapy:  Psychoeducational Skills  Participation Level:  Active  Participation Quality:  Appropriate  Affect:  Appropriate  Cognitive:  Appropriate  Insight:  Good  Engagement in Group:  Good  Engagement in Therapy:  Good  Modes of Intervention:  Support  Summary of Progress/Problems:   Jennifer Flores 11/27/2011, 10:35 PM

## 2011-11-28 NOTE — Progress Notes (Signed)
Patient ID: Jennifer Flores, female   DOB: 22-Mar-1966, 46 y.o.   MRN: [id number] 11-28-11 @ 1426 nursing shift note: D: pt has been more visible today in the milieu, than yesterday. She came to group, with minimal interaction, has gone to meals and taken her medications. She has had some anxiety. A: rn continues to encourage and praise this pt for becoming involved in the milieu. he was also given prn meds for her anxiety. rn will continue to monitor her anxiety and q 15 min cks continue.

## 2011-11-28 NOTE — Progress Notes (Signed)
Psychoeducational Group Note  Date:  11/28/2011 Time:  1000am  Group Topic/Focus:  Making Healthy Choices:   The focus of this group is to help patients identify negative/unhealthy choices they were using prior to admission and identify positive/healthier coping strategies to replace them upon discharge.  Participation Level:  Minimal  Participation Quality:  Inattentive  Affect:  Blunted  Cognitive:  Alert  Insight:  Limited  Engagement in Group:  None  Additional Comments:    Valente David 11/28/2011,10:26 AM

## 2011-11-28 NOTE — Progress Notes (Signed)
Patient ID: Jennifer Flores, female   DOB: November 25, 1965, 46 y.o.   MRN: [id number]  [facility] Group Notes:  (Counselor/Nursing/MHT/Case Management/Adjunct)  11/28/2011 11 AM  Type of Therapy:  Aftercare Planning, Group Therapy, Dance/Movement Therapy   Participation Level:  Did Not Attend    Rhunette Croft 11/28/2011. 3:12 PM

## 2011-11-29 NOTE — Progress Notes (Signed)
Patient ID: Jennifer Flores, female   DOB: [birth year], 46 y.o.   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  Generalized Anxiety Disorder.   The patient was seen on 7/21 and reports the following:   ADL's: Intact.  Sleep: The patient reports poor sleep  Appetite: The patient reports a good appetite this morning.   Mild>(1-10) >Severe  Hopelessness (1-10): 6  Depression (1-10): 7  Anxiety (1-10): 6   Suicidal Ideation: The patient denies any suicidal ideations today.  Plan: No  Intent: No  Means: No    Homicidal Ideation: The patient denies any homicidal ideations today.  Plan: No  Intent: No.  Means: No   General Appearance/Behavior: The patient remains friendly and cooperative today with this provider.  Eye Contact: Good.  Speech:  She has a spontaneous speech  And sequential logic, relating facts. Motor Behavior: wnl.  Level of Consciousness: Alert and Oriented x 3.  Mental Status: Alert and Oriented x 3.  Mood: Appears moderately improved today.  Affect: Pt smiles Anxiety Level: Low level of  anxiety reported today.  Thought Process: disorganized at times. Thought Content: The patient denies any auditory or visual hallucinations today as well as any delusional thinking.  Perception: wnl.  Judgment: poor.  Insight: poor Cognition: Oriented to person, place and time.  Sleep:  Number of Hours: 5.75    Vital Signs:Blood pressure 114/77, pulse 91, temperature 96.8 F (36 C), temperature source Oral, resp. rate 16, height 5' (1.524 m), weight 57.607 kg (127 lb), last menstrual period 11/15/2011.  Current Medications: Current Facility-Administered Medications  Medication Dose Route Frequency Provider Last Rate Last Dose  . acetaminophen (TYLENOL) tablet 650 mg  650 mg Oral Q6H PRN Verne Spurr, PA-C   650 mg at 11/24/11 1957  . alum & mag hydroxide-simeth (MAALOX/MYLANTA)  200-200-20 MG/5ML suspension 30 mL  30 mL Oral Q4H PRN Verne Spurr, PA-C   30 mL at 11/26/11 2223  . buPROPion [facility] SR) 12 hr tablet 150 mg  150 mg Oral Q breakfast Curlene Labrum Readling, MD   150 mg at 11/29/11 0806  . gabapentin (NEURONTIN) capsule 400 mg  400 mg Oral [id number] Curlene Labrum Readling, MD   400 mg at 11/29/11 0806  . hydrOXYzine (ATARAX/VISTARIL) tablet 25 mg  25 mg Oral Q6H PRN Verne Spurr, PA-C   25 mg at 11/29/11 0809  . magnesium hydroxide (MILK OF MAGNESIA) suspension 30 mL  30 mL Oral Daily PRN Verne Spurr, PA-C   30 mL at 11/27/11 2048  . nicotine (NICODERM CQ - dosed in mg/24 hours) patch 21 mg  21 mg Transdermal Daily Curlene Labrum Readling, MD   21 mg at 11/29/11 0807  . risperiDONE (RISPERDAL) tablet 2 mg  2 mg Oral QHS Alyson Kuroski-Mazzei, DO   2 mg at 11/28/11 2105  . sertraline (ZOLOFT) tablet 200 mg  200 mg Oral Lowella Curb, NP   200 mg at 11/29/11 4098  . traZODone (DESYREL) tablet 25 mg  25 mg Oral QHS,MR X 1 Curlene Labrum Readling, MD   25 mg at 11/28/11 2235   Lab Results: No results found for this or any previous visit (from the past 48 hour(s)).  Physical Findings: AIMS: Facial and Oral Movements Muscles of Facial Expression: None, normal Lips and Perioral Area: None, normal Jaw: None, normal Tongue: None, normal,Extremity Movements Upper (arms, wrists, hands, fingers): None, normal Lower (legs,  knees, ankles, toes): None, normal, Trunk Movements Neck, shoulders, hips: None, normal, Overall Severity Severity of abnormal movements (highest score from questions above): None, normal Incapacitation due to abnormal movements: None, normal Patient's awareness of abnormal movements (rate only patient's report): No Awareness, Dental Status Current problems with teeth and/or dentures?: No Does patient usually wear dentures?: No   Review of Systems:  Neurological: The patient denies any headaches today. She also denies any seizure history.  G.I.: The  patient denies any constipation or G.I. Upset today.  Musculoskeletal: The patient denies any muscle or skeletal difficulties.   Time was spent discussing with the patient her current symptoms.   She reports a good appetite and continues to have  moderate feelings of sadness, anhedonia and depressed mood and denies any suicidal or homicidal ideations. The patient denies any auditory or visual hallucinations or delusional thinking.  Willing to stay here and get treatment now. No new acute problems. Thinks poor sleep is a problem but not interested to increase her trazodone if needed.   Treatment Plan Summary:  1. Daily contact with patient to assess and evaluate symptoms and progress in treatment.  2. Medication management  3. The patient will deny suicidal ideations or homicidal ideations for 48 hours prior to discharge and have a depression and anxiety rating of 3 or less. The patient will also deny any auditory or visual hallucinations or delusional thinking.  4. The patient will deny any symptoms of substance withdrawal at time of discharge.  5.  Pt reported blurred vision this am but says vision is clear this afternoon.  Plan:  1. Will continue the medication Zoloft at 200 mgs po q am for depression and anxiety.  2. Will continue the medication Wellbutrin SR at 150 mgs po q am to also address her depressive symptoms.  3. Will continue the medication Risperdal at 2 mgs po qhs for mood stabilization.  4. Will continue the medication Trazodone at 25 mgs po qhs for sleep. Will consider increasing later if pt is willing 5. Will continue the medication Neurontin at 400 mgs po q am, 2 pm and hs for anxiety.  6. Laboratory studies reviewed.  7. Will continue to monitor.   Jennifer Flores 11/29/2011, 9:32 AM

## 2011-11-29 NOTE — Progress Notes (Signed)
Psychoeducational Group Note  Date:  11/29/2011 Time:  1100  Group Topic/Focus:  Wellness Toolbox:   The focus of this group is to discuss various aspects of wellness, balancing those aspects and exploring ways to increase the ability to experience wellness.  Patients will create a wellness toolbox for use upon discharge.  Participation Level:  Minimal  Participation Quality:  Appropriate  Affect:  Appropriate  Cognitive:  Appropriate  Insight:  Limited  Engagement in Group:  Good  Additional Comments:  Pt was able to attend group this morning and participation was adequate.  Journey Castonguay E 11/29/2011, 6:03 PM

## 2011-11-29 NOTE — Therapy (Signed)
Psychoeducational Group Note  Date:  11/28/2011 Time:  2005  Group Topic/Focus:  Wrap-Up Group:   The focus of this group is to help patients review their daily goal of treatment and discuss progress on daily workbooks.  Participation Level:  Minimal  Participation Quality:  Sharing  Affect:  Depressed  Cognitive:  Appropriate  Insight:  Good  Engagement in Group:  Good  Additional Comments:   Patient attended and participated in wrap-up group this evening. Patient was weeping and saying that she was sad because she could not see her family.  Atarah Cadogan, Newton Pigg 11/29/2011, 2:57 AM

## 2011-11-29 NOTE — Progress Notes (Signed)
D: Pt has been visible in milieu and interacting well with peers. Appears flat and depressed. Calm and cooperative with assessment. No acute distress noted. States she had a good day and enjoyed the groups. States she enjoyed the "resources group" the most. Is eager for D/C tomorrow. States she intends to stay on her medications as they are ordered when she leaves. Denies SI/HI/AVH and contracts for safety.   A: Support and encouragement provided. Safety has been maintained with Q15 minute observation. POC and medications for the shift reviewed and understanding verbalized. Reminded pt of the need to stay med compliant to avoid decompensation and a repeat admission. Dispensed medications as ordered by MD.   R: Pt remains safe. She visible and cooperative. She is compliant with her treatment plan and is eager for D/C. Will continue Q15 minute observation and continue current POC.

## 2011-11-29 NOTE — Progress Notes (Signed)
[facility] Case Management Discharge Plan:  Will you be returning to the same living situation after discharge: Yes,  Patient to return to her home At discharge, do you have transportation home?:Yes,  Patient to be transported by [facility] Do you have the ability to pay for your medications:Yes,  Patient has Medicare  Interagency Information:     Release of information consent forms completed and in the chart;  Patient's signature needed at discharge.  Patient to Follow up at:  Follow-up Information    Follow up with [facility] on 11/30/2011. (You are scheduled to be seen at [facility] at 8:30 AM on Tuesday, November 30, 2011.  Referral [phone])    Contact information:   [address] [place], [place]  [address]  [phone]         Patient denies SI/HI:   Yes,  Patient is not endorsing SI/HI or other thoughts of self harm    Safety Planning and Suicide Prevention discussed:  Yes,  Reviewed during gorups  Barrier to discharge identified:No.  Summary and Recommendations: Patient encourage to be compliant with medications and follow up with outpatient recommendations.  Refer also made for ACT Team    Wynn Banker 11/29/2011, 10:44 AM

## 2011-11-29 NOTE — Progress Notes (Signed)
Patient ID: Jennifer Flores, female   DOB: [birth year], 46 y.o.   MRN: [id number] Patient reports fair sleep and improving appetite.  She reports normal energy level and improving ability to pay attention.  She rates her depression a 5 and hopelessness a 6.  She reports thoughts of self harm off and on, but denies them at this time.  She contracts for safety.  Pt has been attending groups.  A offered pt support. R- Pt requested vistaril for anxiety this am and has been participating in groups all day.

## 2011-11-29 NOTE — Progress Notes (Signed)
Writer met with patient to have her sign consent to contact for transportation. Writer later advised by MD that patient will not discharge home today.  Writer contact [facility] to change follow up appointment and to make MD recommendations for ACT Team follow up.  Transportation scheduled for tomorrow at 14:00.   Per State Regulation 482.30 This chart was reviewed for medical necessity with respect to the patient's  Admission/Duration of Stay  [facility], LCSW @7 /22/2013    Next Review Date7/25/2013

## 2011-11-29 NOTE — Progress Notes (Addendum)
11/29/2011         Time: 0930      Group Topic/Focus: The focus of this group is on enhancing the patient's understanding of leisure, barriers to leisure, and the importance of engaging in positive leisure activities upon discharge for improved total health.  Participation Level: Active  Participation Quality: Attentive  Affect: Blunted  Cognitive: Oriented   Additional Comments: Patient able to identify leisure interests she can engage in upon discharge.  Allannah Kempen 11/29/2011 11:55 AM

## 2011-11-29 NOTE — Progress Notes (Signed)
[facility] Group Notes:  (Counselor/Nursing/MHT/Case Management/Adjunct)  11/29/2011 3:17 PM  Type of Therapy:  Group Therapy  Participation Level:  Minimal  Participation Quality:  Attentive and Resistant  Affect:  Irritable  Cognitive:  Oriented  Insight:  Limited  Engagement in Group:  Limited  Engagement in Therapy:  Limited  Modes of Intervention:  Clarification, Education, Problem-solving and Support  Summary of Progress/Problems: Patient was resistant to answering questions. She stated that she was looking for resources but when questioned what kind of resources she needed, she wanted counselor to move on to others. Later she stated it was hard to find resources. Upset that she does not qualify for ACTT. She could not identify any other needs. She would not identify anything that she has accomplished while being in the hospital.  Veto Kemps 11/29/2011, 3:17 PM

## 2011-11-29 NOTE — Progress Notes (Signed)
Psychoeducational Group Note  Date:  11/29/2011 Time:  2000  Group Topic/Focus:  Wrap-Up Group:   The focus of this group is to help patients review their daily goal of treatment and discuss progress on daily workbooks.  Participation Level:  Active  Participation Quality:  Appropriate and Sharing  Affect:  Appropriate  Cognitive:  Appropriate  Insight:  Good  Engagement in Group:  Good  Additional Comments:  Pt goal was to stay up all day and pt accomplished it. Pt stated she feels good and hopes she go home tomorrow.  Isla Pence M 11/29/2011, 9:20 PM

## 2011-11-29 NOTE — Progress Notes (Signed)
Patient ID: Jennifer Flores, female   DOB: [birth year], 46 y.o.   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  Generalized Anxiety Disorder.   The patient was seen today and reported that she was anxious and took some Vistaril this morning. She also reported that she felt dizzy this morning but did not fall.  ADL's: Intact.  Sleep: The patient reports to sleeping very well last night  Appetite: The patient reports a good appetite this morning.   Mild>(1-10) >Severe  Hopelessness (1-10): 3 Depression (1-10): 5 1/2  Anxiety (1-10): 9   Suicidal Ideation: The patient denies any suicidal ideations today.  Plan: No  Intent: No  Means: No    Homicidal Ideation: The patient denies any homicidal ideations today.  Plan: No  Intent: No.  Means: No   General Appearance/Behavior: The patient remains friendly and cooperative today but got anxious  Eye Contact: Good.  Speech:  Was normal in volume rate and tone, spontaneous Motor Behavior: Normal.  Level of Consciousness: Alert and Oriented x 3.  Mental Status: Alert and Oriented x 3.  Mood: Improved in regards to depression but still anxious Affect: Affect was congruent with mood Anxiety Level:  anxiety level was elevated   today.  Thought Process: wnl.  Thought Content: The patient denies any auditory or visual hallucinations today as well as any delusional thinking.  Perception: wnl.  Judgment: Good.  Insight: Good.  Cognition: Oriented to person, place and time.  Sleep:  Number of Hours: 5.75    Vital Signs:Blood pressure 114/77, pulse 91, temperature 96.8 F (36 C), temperature source Oral, resp. rate 16, height 5' (1.524 m), weight 127 lb (57.607 kg), last menstrual period 11/15/2011.  Current Medications: Current Facility-Administered Medications  Medication Dose Route Frequency Provider Last Rate Last Dose  . acetaminophen (TYLENOL)  tablet 650 mg  650 mg Oral Q6H PRN Verne Spurr, PA-C   650 mg at 11/24/11 1957  . alum & mag hydroxide-simeth (MAALOX/MYLANTA) 200-200-20 MG/5ML suspension 30 mL  30 mL Oral Q4H PRN Verne Spurr, PA-C   30 mL at 11/26/11 2223  . buPROPion [facility] SR) 12 hr tablet 150 mg  150 mg Oral Q breakfast Curlene Labrum Readling, MD   150 mg at 11/29/11 0806  . gabapentin (NEURONTIN) capsule 400 mg  400 mg Oral [id number] Curlene Labrum Readling, MD   400 mg at 11/29/11 0806  . hydrOXYzine (ATARAX/VISTARIL) tablet 25 mg  25 mg Oral Q6H PRN Verne Spurr, PA-C   25 mg at 11/29/11 0809  . magnesium hydroxide (MILK OF MAGNESIA) suspension 30 mL  30 mL Oral Daily PRN Verne Spurr, PA-C   30 mL at 11/27/11 2048  . nicotine (NICODERM CQ - dosed in mg/24 hours) patch 21 mg  21 mg Transdermal Daily Curlene Labrum Readling, MD   21 mg at 11/29/11 0807  . risperiDONE (RISPERDAL) tablet 2 mg  2 mg Oral QHS Alyson Kuroski-Mazzei, DO   2 mg at 11/28/11 2105  . sertraline (ZOLOFT) tablet 200 mg  200 mg Oral Lowella Curb, NP   200 mg at 11/29/11 4098  . traZODone (DESYREL) tablet 25 mg  25 mg Oral QHS,MR X 1 Curlene Labrum Readling, MD   25 mg at 11/28/11 2235   Lab Results: No results found for this or any previous visit (from the past 48 hour(s)).  Physical Findings: AIMS: Facial and Oral Movements Muscles  of Facial Expression: None, normal Lips and Perioral Area: None, normal Jaw: None, normal Tongue: None, normal,Extremity Movements Upper (arms, wrists, hands, fingers): None, normal Lower (legs, knees, ankles, toes): None, normal, Trunk Movements Neck, shoulders, hips: None, normal, Overall Severity Severity of abnormal movements (highest score from questions above): None, normal Incapacitation due to abnormal movements: None, normal Patient's awareness of abnormal movements (rate only patient's report): No Awareness, Dental Status Current problems with teeth and/or dentures?: No Does patient usually wear dentures?: No    Review of Systems:  Neurological: The patient denies any extrapyramidal symptoms, any ataxic gait, any sensory issues, any headaches. G.I.: The patient denies any constipation or G.I. Upset today.  Musculoskeletal: The patient denies any muscle or skeletal difficulties.   Time was spent today discussing with the patient her current symptoms. Pt seems to have better insight into illness and agrees that she needs to take her medications regularly. She feels that her anxiety at level is elevated this morning as she is nervous about going home. She however acknowledges that her mood has improved, she is not suicidal or homicidal. Discussed in length with patient her concerns in regards to going home. An ACT team referral also sent  Treatment Plan Summary:  1. Daily contact with patient to assess and evaluate symptoms and progress in treatment.  2. Medication management  3. The patient will deny suicidal ideations or homicidal ideations for 48 hours prior to discharge and have a depression and anxiety rating of 3 or less. The patient will also deny any auditory or visual hallucinations or delusional thinking.   Plan:  1. Will continue the medication Zoloft at 200 mgs po q am for depression and anxiety.  2. Will continue the medication Wellbutrin SR at 150 mgs po q am to also address her depressive symptoms.  3. Will continue the medication Risperdal at 2 mgs po qhs for mood stabilization.  4. Will continue the medication Trazodone at 25 mgs po qhs for sleep.  5. Will continue the medication Neurontin at 400 mgs po q am, 2 pm and hs for anxiety.  6. discharge planning for tomorrow morning, patient to followup with [facility] recovery services on discharge  [facility] 11/29/2011, 1:31 PM

## 2011-11-30 DIAGNOSIS — F3160 Bipolar disorder, current episode mixed, unspecified: Principal | ICD-10-CM

## 2011-11-30 MED ORDER — BUPROPION HCL ER (SR) 150 MG PO TB12: 150.0000 mg | ORAL_TABLET | Freq: Every day | ORAL | Status: DC

## 2011-11-30 MED ORDER — RISPERIDONE 2 MG PO TABS: 2.0000 mg | ORAL_TABLET | Freq: Every day | ORAL | Status: DC

## 2011-11-30 MED ORDER — GABAPENTIN 400 MG PO CAPS: 400.0000 mg | ORAL_CAPSULE | ORAL | Status: DC

## 2011-11-30 MED ORDER — TRAZODONE 25 MG HALF TABLET: 25.0000 mg | ORAL_TABLET | Freq: Every evening | ORAL | Status: DC | PRN

## 2011-11-30 MED ORDER — HYDROXYZINE HCL 25 MG PO TABS: 25.0000 mg | ORAL_TABLET | Freq: Every day | ORAL | Status: DC

## 2011-11-30 MED ORDER — SERTRALINE HCL 100 MG PO TABS: 200.0000 mg | ORAL_TABLET | ORAL | Status: DC

## 2011-11-30 NOTE — Discharge Summary (Signed)
Physician Discharge Summary Note  Patient:  Jennifer Flores is an 46 y.o., female MRN:  [id number] DOB:  1965-06-20 Patient phone:  [phone] (home)  Patient address:   [address] [place] [address],   Date of Admission:  11/19/2011 Date of Discharge:11/30/2011   Reason for Admission: worsening symptoms  Discharge Diagnoses: Principal Problem:  *Major depressive disorder, recurrent severe without psychotic features Active Problems:  Generalized anxiety disorder   Axis Diagnosis: Discharge Diagnoses:  AXIS I: Bipolar, mixed and Generalized Anxiety Disorder  AXIS II: Deferred  AXIS III:  Past Medical History   Diagnosis  Date   .  Bipolar disease, chronic  2000     Dr. Betti Cruz /   .  Suicide attempt    .  Spider bite  2008     Seen in the ED   .  Narcotic addiction    .  Alcohol addiction    .  Scabies    .  Anxiety    .  Panic attack    .  Palpitations    .  Depression     AXIS IV: problems with primary support group  AXIS V: 51-60 moderate symptoms   `Level of Care:  Out patient  Hospital Course: Jennifer Flores was admitted for worsening symptoms of depression and anxiety after not taking her medication for several days to weeks. She was restarted on medications including risperidol 2mg , for psychosis and sleep.  She was also given trazodone for sleep.  For depression she wa  s started on Sertraline and titrated up to 100mg  a day.  For anxiety she was given Neurontin and titrated up to 400mg  a day.  Wellbutrin was added as adjuvant therapy to the sertraline.  Jennifer Flores was evaluated daily by a clinical provider.  Her response to treatment was measured by a daily self inventory evaluating her mental and emotional status.  She was asked to attend unit programming in groups offered on the 400 hall.  Jennifer Flores did attend an participated appropriately.  She was active in the unit milieu and responded appropriately with staff and other patients.  As her symptoms began  to resolve she was able to work with the CM to address issues related to her continued recovery at home upon discharge.  Jennifer Flores was given several coping mechanisms to consider using and strongly encouraged to work with social supports as listed.  The patient felt ready for discharge and voiced significant improvement in her symptoms.  She was in much improved condition than upon admission.  She was medically stable.  Jennifer Flores met with the treatment team and all felt that she was ready for discharge.  Consults:  none  Significant Diagnostic Studies:  none  Discharge Vitals:   Blood pressure 114/77, pulse 91, temperature 96.8 F (36 C), temperature source Oral, resp. rate 16, height 5' (1.524 m), weight 57.607 kg (127 lb), last menstrual period 11/15/2011.  Mental Status Exam: See Mental Status Examination and Suicide Risk Assessment completed by Attending Physician prior to discharge.  Discharge destination:  home  Is patient on multiple antipsychotic therapies at discharge:  no   Has Patient had three or more failed trials of antipsychotic monotherapy by history:  no  Recommended Plan for Multiple Antipsychotic Therapies: N/A    Medication List  As of 11/30/2011 10:10 PM   STOP taking these medications         ALPRAZolam 0.25 MG tablet      clonazePAM 1 MG tablet  TAKE these medications      Indication    buPROPion 150 MG 12 hr tablet   Commonly known as: WELLBUTRIN SR   Take 1 tablet (150 mg total) by mouth daily with breakfast. For depression    Indication: Major Depressive Disorder      gabapentin 400 MG capsule   Commonly known as: NEURONTIN   Take 1 capsule (400 mg total) by mouth 3 (three) times daily at 8am, 2pm and bedtime. For mood stabilization/ anxiety       hydrOXYzine 25 MG tablet   Commonly known as: ATARAX/VISTARIL   Take 1 tablet (25 mg total) by mouth at bedtime.       risperiDONE 2 MG tablet   Commonly known as: RISPERDAL   Take 1 tablet (2 mg  total) by mouth at bedtime. For mood stabilization       sertraline 100 MG tablet   Commonly known as: ZOLOFT   Take 2 tablets (200 mg total) by mouth every morning. For anxiety and depression       traZODone 25 mg Tabs   Commonly known as: DESYREL   Take 0.5 tablets (25 mg total) by mouth at bedtime and may repeat dose one time if needed. For sleep            Follow-up Information    Follow up with [facility] on 12/02/2011. (You are scheduled to be seen at [facility] at 7:45 AM on Thursday, December 02, 2011.  Referral [phone])    Contact information:   [address] [place], [place]  [address]  [phone]         Follow-up recommendations:  Take all of your medications as prescribed.  Be sure to keep ALL follow up appointments as scheduled. This is to ensure getting your refills on time to avoid any interruption in your medication.  If you find that you can not keep your appointment, call the clinic and reschedule. Be sure to tell the nurse if you will need a refill before your appointment.  Comments:    Signed: Lloyd Huger T. Raymond Bhardwaj PAC For Dr. Nelly Rout 11/30/2011, 10:10 PM

## 2011-11-30 NOTE — Tx Team (Signed)
Interdisciplinary Treatment Plan Update (Adult)  Date:  11/30/2011  Time Reviewed:  9:44 AM   Progress in Treatment: Attending groups:   Yes   Participating in groups:  Yes Taking medication as prescribed:  Yes Tolerating medication:  Yes Family/Significant othe contact made: Refuses Patient understands diagnosis:  Yes Discussing patient identified problems/goals with staff: Yes Medical problems stabilized or resolved: Yes Denies suicidal/homicidal ideation:Yes Issues/concerns per patient self-inventory: None Other:  New problem(s) identified:  None  Reason for Continuation of Hospitalization:  None  Interventions implemented related to continuation of hospitalization:  N/A - Discharge today  Additional comments:  Estimated length of stay:  D/c today  Discharge Plan:  Home with outpatient follow up at [facility]  New goal(s):  Review of initial/current patient goals per problem list:    1.  Goal(s): Reduce depression/anxiety   Met:  Yes  Target date: d/c  As evidenced by: Patient currently rating symptoms at four or below    2.  Goal (s): .stabilize on meds   Met:  Yes  Stable for discharge  Target date: d/c  As evidenced by: Patient will report being stable on medications - symptoms have decreased    3.  Goal(s):  Decrease blurriness/feelings of being off balacne  Met:  Yes  Target date:  D/c  As evidenced by:  Patient no longer endorsing symptoms     Attendees: Patient:  Jennifer Flores 11/30/2011 10:21 AM  Nursing:     Physician: Nelly Rout, MD 11/30/2011 9:44 AM   Nursing:    11/30/2011 9:44 AM   CaseManager:  Juline Patch, LCSW 11/30/2011 9:44 AM   Counselor: Veto Kemps, MT 11/30/2011 9:44 AM   Other:  Ambrose Mantle     11/30/2011 10:23 AM    Scribe:  11/30/2011 11:12 AM

## 2011-11-30 NOTE — Progress Notes (Signed)
[facility] Case Management Discharge Plan:  Will you be returning to the same living situation after discharge: Yes,  Patient returning to her home At discharge, do you have transportation home?:Yes,  Patient transported home by [facility] transportation Do you have the ability to pay for your medications:Yes,  Patient has Medicare  Interagency Information:     Release of information consent forms completed and in the chart;  Patient's signature needed at discharge.  Patient to Follow up at:  Follow-up Information    Follow up with [facility] on 12/02/2011. (You are scheduled to be seen at [facility] at 7:45 AM on Thursday, December 02, 2011.  Referral [phone])    Contact information:   [address] [place], [place]  [address]  [phone]         Patient denies SI/HI:   Yes,  Patient denies SI/HI other thoughts of self harm    Safety Planning and Suicide Prevention discussed:  Yes,  Reviewed with patient individually  Barrier to discharge identified:Yes,  Limited support  Summary and Recommendations: Patient encourage to be compliant with medications and follow up with outpatient recommendation.  [facility] contacted to recommend patient be referred to an ACT Team    Wynn Banker 11/30/2011, 2:37 PM

## 2011-11-30 NOTE — Progress Notes (Signed)
Patient ID: Jennifer Flores, female   DOB: 1965-10-14, 46 y.o.   MRN: [id number] Pt was discharged ambulatory to take case manager arranged  Transportation home.  She denies SI/HI.  She verbalizes understanding of her discharge meds and followup.  She was given a suicide prevention pamphlet. She plans to follow up at [facility].

## 2011-11-30 NOTE — [facility] Suicide Risk Assessment (Addendum)
Suicide Risk Assessment  Discharge Assessment     Demographic factors:    history of chronic mental illness Patient lives alone    Current Mental Status Per Nursing Assessment::   On Admission:    At Discharge:     Current Mental Status Per Physician: Patient is casually dressed, alert and oriented x3 Patient reported her mood as okay her affect however was anxious as she was not sure how she would manage at home. She however denied any suicidal ideation any homicidal ideation any delusions or paranoia. Her thought processes are organized and goal directed. Her insight into her behavior and illness fluctuates between fair to poor and so does her judgment  Loss Factors:   none reported  Historical Factors:   history of mental illness  Risk Reduction Factors:     patient is doing fairly well on her medications, has a good relationship with her psychiatrist and [facility] in length with not [place]  Continued Clinical Symptoms:   Patient has some anxiety but no suicidal or homicidal ideation Discharge Diagnoses:   AXIS I:  Bipolar, mixed and Generalized Anxiety Disorder AXIS II:  Deferred AXIS III:   Past Medical History  Diagnosis Date  . Bipolar disease, chronic 2000    Dr. Betti Cruz /   . Suicide attempt   . Spider bite 2008    Seen in the ED   . Narcotic addiction   . Alcohol addiction   . Scabies   . Anxiety   . Panic attack   . Palpitations   . Depression    AXIS IV:  problems with primary support group AXIS V:  51-60 moderate symptoms  Cognitive Features That Contribute To Risk:   Patient has some anxiety about returning home as she lives alone. A referral to act team was made  Suicide Risk:  Minimal: No identifiable suicidal ideation.  Patients presenting with no risk factors but with morbid ruminations; may be classified as minimal risk based on the severity of the depressive symptoms  Plan Of Care/Follow-up recommendations:  Activity:  As  tolerated Diet:  No restrictions Other:  Followup with [facility]  [facility] 11/30/2011, 10:12 AM

## 2011-12-01 NOTE — Progress Notes (Signed)
[facility] Group Notes:  (Counselor/Nursing/MHT/Case Management/Adjunct)  12/01/2011 12:22 PM  Type of Therapy:  Group Therapy  11/30/11  Participation Level:  None  Participation Quality:  Patient was present during the first few minutes of group, but left to go to the bathroom and did not return.  Immanuel Fedak, Aram Beecham 12/01/2011, 12:22 PM

## 2011-12-02 NOTE — Progress Notes (Signed)
Patient Discharge Instructions:  After Visit Summary (AVS):   Faxed to:  12/02/2011 Psychiatric Admission Assessment Note:   Faxed to:  12/02/2011 Suicide Risk Assessment - Discharge Assessment:   Faxed to:  12/02/2011 Faxed/Sent to the Next Level Care provider:  12/02/2011  Faxed to [facility] @ [phone]  Thijs Brunton, Eduard Clos, 12/02/2011, 3:20 PM

## 2011-12-11 ENCOUNTER — Emergency Department (HOSPITAL_COMMUNITY)
Admission: EM | Admit: 2011-12-11 | Discharge: 2011-12-12 | Disposition: A | Discharge status: Home / Self Care | Payer: Medicare Other | Attending: Emergency Medicine | Admitting: Emergency Medicine

## 2011-12-11 ENCOUNTER — Encounter (HOSPITAL_COMMUNITY): Payer: Self-pay | Admitting: Emergency Medicine

## 2011-12-11 DIAGNOSIS — F411 Generalized anxiety disorder: Secondary | ICD-10-CM | POA: Insufficient documentation

## 2011-12-11 DIAGNOSIS — F17200 Nicotine dependence, unspecified, uncomplicated: Secondary | ICD-10-CM | POA: Insufficient documentation

## 2011-12-11 DIAGNOSIS — Z79899 Other long term (current) drug therapy: Secondary | ICD-10-CM | POA: Insufficient documentation

## 2011-12-11 DIAGNOSIS — F319 Bipolar disorder, unspecified: Secondary | ICD-10-CM | POA: Insufficient documentation

## 2011-12-11 DIAGNOSIS — F419 Anxiety disorder, unspecified: Secondary | ICD-10-CM

## 2011-12-11 LAB — URINALYSIS, ROUTINE W REFLEX MICROSCOPIC
Bilirubin Urine: NEGATIVE
Glucose, UA: NEGATIVE mg/dL
Hgb urine dipstick: NEGATIVE
Ketones, ur: NEGATIVE mg/dL
Leukocytes, UA: NEGATIVE
Nitrite: NEGATIVE
Protein, ur: NEGATIVE mg/dL
Specific Gravity, Urine: 1.02 (ref 1.005–1.030)
Urobilinogen, UA: 0.2 mg/dL (ref 0.0–1.0)
pH: 6 (ref 5.0–8.0)

## 2011-12-11 LAB — CBC WITH DIFFERENTIAL/PLATELET
Basophils Absolute: 0 10*3/uL (ref 0.0–0.1)
Basophils Relative: 0 % (ref 0–1)
Eosinophils Absolute: 0.2 10*3/uL (ref 0.0–0.7)
Eosinophils Relative: 1 % (ref 0–5)
HCT: 37.6 % (ref 36.0–46.0)
Hemoglobin: 13.2 g/dL (ref 12.0–15.0)
Lymphocytes Relative: 18 % (ref 12–46)
Lymphs Abs: 2.5 10*3/uL (ref 0.7–4.0)
MCH: 32.3 pg (ref 26.0–34.0)
MCHC: 35.1 g/dL (ref 30.0–36.0)
MCV: 91.9 fL (ref 78.0–100.0)
Monocytes Absolute: 0.9 10*3/uL (ref 0.1–1.0)
Monocytes Relative: 7 % (ref 3–12)
Neutro Abs: 10.5 10*3/uL — ABNORMAL HIGH (ref 1.7–7.7)
Neutrophils Relative %: 74 % (ref 43–77)
Platelets: 263 10*3/uL (ref 150–400)
RBC: 4.09 MIL/uL (ref 3.87–5.11)
RDW: 13.1 % (ref 11.5–15.5)
WBC: 14.1 10*3/uL — ABNORMAL HIGH (ref 4.0–10.5)

## 2011-12-11 LAB — COMPREHENSIVE METABOLIC PANEL
ALT: 12 U/L (ref 0–35)
AST: 17 U/L (ref 0–37)
Albumin: 4.2 g/dL (ref 3.5–5.2)
Alkaline Phosphatase: 100 U/L (ref 39–117)
BUN: 8 mg/dL (ref 6–23)
CO2: 22 mEq/L (ref 19–32)
Calcium: 9.6 mg/dL (ref 8.4–10.5)
Chloride: 99 mEq/L (ref 96–112)
Creatinine, Ser: 0.59 mg/dL (ref 0.50–1.10)
GFR calc Af Amer: >90 mL/min (ref >90)
GFR calc non Af Amer: >90 mL/min (ref >90)
Glucose, Bld: 92 mg/dL (ref 70–99)
Potassium: 3.3 mEq/L — ABNORMAL LOW (ref 3.5–5.1)
Sodium: 134 mEq/L — ABNORMAL LOW (ref 135–145)
Total Bilirubin: 0.2 mg/dL — ABNORMAL LOW (ref 0.3–1.2)
Total Protein: 7.7 g/dL (ref 6.0–8.3)

## 2011-12-11 LAB — LIPASE, BLOOD: Lipase: 18 U/L (ref 11–59)

## 2011-12-11 MED ORDER — ONDANSETRON HCL 4 MG/2ML IJ SOLN
4.0000 mg | Freq: Once | INTRAMUSCULAR | Status: AC
  Administered 2011-12-11: 4 mg via INTRAVENOUS
  Filled 2011-12-11: qty 2

## 2011-12-11 MED ORDER — SODIUM CHLORIDE 0.9 % IV BOLUS (SEPSIS)
500.0000 mL | Freq: Once | INTRAVENOUS | Status: AC
  Administered 2011-12-11: 500 mL via INTRAVENOUS

## 2011-12-11 NOTE — ED Provider Notes (Addendum)
History   This chart was scribed for [employer]. Rubin Payor, MD by Charolett Bumpers . The patient was seen in room APA15/APA15. Patient's care was started at 2034.    CSN: [id number]  Arrival date & time 12/11/11  2027   First MD Initiated Contact with Patient 12/11/11 2034      Chief Complaint  Patient presents with  . Panic Attack    (Consider location/radiation/quality/duration/timing/severity/associated sxs/prior treatment) HPI Jennifer Flores is a 46 y.o. female who presents to the Emergency Department complaining of constant, moderate nausea with associated tingling in her hands, feet and face. Pt reports reports a recent change in medications. Pt reports that she has been on the medication for the past 2 weeks. Pt reports that she has not felt well since the change in medication. Pt reports a h/o panic attacks and anxiety. Pt states that she "feels like she is having a panic attack". Pt states that she is unsure of what medication she is taking.   Past Medical History  Diagnosis Date  . Bipolar disease, chronic 2000    Dr. Betti Cruz /   . Suicide attempt   . Spider bite 2008    Seen in the ED   . Narcotic addiction   . Alcohol addiction   . Scabies   . Anxiety   . Panic attack   . Palpitations   . Depression     Past Surgical History  Procedure Date  . Cesarean section 2004  . Extraction of teeth 2010    Family History  Problem Relation Age of Onset  . Heart disease Mother   . Hypertension Mother   . Diabetes Father   . Anxiety disorder Father   . Anxiety disorder Sister     History  Substance Use Topics  . Smoking status: Current Everyday Smoker -- 2.0 packs/day    Types: Cigarettes  . Smokeless tobacco: Not on file  . Alcohol Use: No     patient said she quit approx 2 years, did have an addiction problem --drinking tonight 12/30    OB History    Grav Para Term Preterm Abortions TAB SAB Ect Mult Living                  Review of Systems    Gastrointestinal: Positive for nausea. Negative for vomiting.  Neurological:       Tingling  Psychiatric/Behavioral: The patient is nervous/anxious.   All other systems reviewed and are negative.    Allergies  Review of patient's allergies indicates no known allergies.  Home Medications   Current Outpatient Rx  Name Route Sig Dispense Refill  . BUPROPION HCL ER (SR) 150 MG PO TB12 Oral Take 1 tablet (150 mg total) by mouth daily with breakfast. For depression 30 tablet 0  . GABAPENTIN 400 MG PO CAPS Oral Take 1 capsule (400 mg total) by mouth 3 (three) times daily at 8am, 2pm and bedtime. For mood stabilization/ anxiety 90 capsule 0  . HYDROXYZINE HCL 25 MG PO TABS Oral Take 1 tablet (25 mg total) by mouth at bedtime. 30 tablet 0    For anxiety and sleep  . RISPERIDONE 2 MG PO TABS Oral Take 1 tablet (2 mg total) by mouth at bedtime. For mood stabilization 30 tablet 0  . SERTRALINE HCL 100 MG PO TABS Oral Take 2 tablets (200 mg total) by mouth every morning. 60 tablet 0    For depression and anxiety  . TRAZODONE 25 MG HALF  TABLET Oral Take 0.5 tablets (25 mg total) by mouth at bedtime and may repeat dose one time if needed. For sleep 30 each 0    BP 133/83  Pulse 88  Temp 98.1 F (36.7 C) (Oral)  Resp 20  Ht 5' (1.524 m)  Wt 120 lb (54.432 kg)  BMI 23.44 kg/m2  SpO2 100%  LMP 11/15/2011  Physical Exam  Nursing note and vitals reviewed. Constitutional: She is oriented to person, place, and time. She appears well-developed and well-nourished. No distress.  HENT:  Head: Normocephalic and atraumatic.  Eyes: EOM are normal. Pupils are equal, round, and reactive to light.  Neck: Neck supple. No tracheal deviation present.  Cardiovascular: Normal rate, regular rhythm and normal heart sounds.   Pulmonary/Chest: Effort normal and breath sounds normal. No respiratory distress. She has no wheezes.  Abdominal: Soft. Bowel sounds are normal. She exhibits no distension. There is  tenderness. There is no rebound and no guarding.       Mild RUQ tenderness.   Musculoskeletal: Normal range of motion. She exhibits no edema.  Neurological: She is alert and oriented to person, place, and time. No sensory deficit.  Skin: Skin is warm and dry.       Poor skin.   Psychiatric: Her behavior is normal. Her mood appears anxious.    ED Course  Procedures (including critical care time)  DIAGNOSTIC STUDIES: Oxygen Saturation is 100% on room air, normal by my interpretation.    COORDINATION OF CARE:  20:46-Discussed planned course of treatment with the patient, who is agreeable at this time.   21:00-Medication Orders: Ondansetron (Zofran) injection 4 mg-once; Sodium chloride 0.9% bolus 500 mL-once  Results for orders placed during the hospital encounter of 12/11/11  CBC WITH DIFFERENTIAL      Component Value Range   WBC 14.1 (*) 4.0 - 10.5 K/uL   RBC 4.09  3.87 - 5.11 MIL/uL   Hemoglobin 13.2  12.0 - 15.0 g/dL   HCT 01.0  27.2 - 53.6 %   MCV 91.9  78.0 - 100.0 fL   MCH 32.3  26.0 - 34.0 pg   MCHC 35.1  30.0 - 36.0 g/dL   RDW 64.4  03.4 - 74.2 %   Platelets 263  150 - 400 K/uL   Neutrophils Relative 74  43 - 77 %   Neutro Abs 10.5 (*) 1.7 - 7.7 K/uL   Lymphocytes Relative 18  12 - 46 %   Lymphs Abs 2.5  0.7 - 4.0 K/uL   Monocytes Relative 7  3 - 12 %   Monocytes Absolute 0.9  0.1 - 1.0 K/uL   Eosinophils Relative 1  0 - 5 %   Eosinophils Absolute 0.2  0.0 - 0.7 K/uL   Basophils Relative 0  0 - 1 %   Basophils Absolute 0.0  0.0 - 0.1 K/uL  COMPREHENSIVE METABOLIC PANEL      Component Value Range   Sodium 134 (*) 135 - 145 mEq/L   Potassium 3.3 (*) 3.5 - 5.1 mEq/L   Chloride 99  96 - 112 mEq/L   CO2 22  19 - 32 mEq/L   Glucose, Bld 92  70 - 99 mg/dL   BUN 8  6 - 23 mg/dL   Creatinine, Ser 5.95  0.50 - 1.10 mg/dL   Calcium 9.6  8.4 - 63.8 mg/dL   Total Protein 7.7  6.0 - 8.3 g/dL   Albumin 4.2  3.5 - 5.2 g/dL   AST 17  0 - 37 U/L   ALT 12  0 - 35 U/L    Alkaline Phosphatase 100  39 - 117 U/L   Total Bilirubin 0.2 (*) 0.3 - 1.2 mg/dL   GFR calc non Af Amer >90  >90 mL/min   GFR calc Af Amer >90  >90 mL/min  LIPASE, BLOOD      Component Value Range   Lipase 18  11 - 59 U/L  URINALYSIS, ROUTINE W REFLEX MICROSCOPIC      Component Value Range   Color, Urine YELLOW  YELLOW   APPearance CLEAR  CLEAR   Specific Gravity, Urine 1.020  1.005 - 1.030   pH 6.0  5.0 - 8.0   Glucose, UA NEGATIVE  NEGATIVE mg/dL   Hgb urine dipstick NEGATIVE  NEGATIVE   Bilirubin Urine NEGATIVE  NEGATIVE   Ketones, ur NEGATIVE  NEGATIVE mg/dL   Protein, ur NEGATIVE  NEGATIVE mg/dL   Urobilinogen, UA 0.2  0.0 - 1.0 mg/dL   Nitrite NEGATIVE  NEGATIVE   Leukocytes, UA NEGATIVE  NEGATIVE     No diagnosis found.    MDM  Patient is complaining of nausea panic and tingling in her hands and her feet. She states this is from the recent change in her medications. She was recently inpatient behavioral health hospital and had her medications changed. She was asking if she could go back to the old medicine. Patient was previously on Xanax 0.25 each bedtime Klonopin 1 mg 3 times a day Atarax 25 mg each bedtime Risperdal 3 mg each bedtime Zoloft 200 mg every morning Trazodone 300 mg at bedtime  She is now on Wellbutrin 150 mg twice a day Neurontin 400 mg 3 times a day Atarax 25 mg each bedtime Respiratory 2 mg each bedtime Zoloft 200 mg every morning Trazodone 25 mg each bedtime  I personally performed the services described in this documentation, which was scribed in my presence. The recorded information has been reviewed and considered.        Juliet Rude. Rubin Payor, MD 12/11/11 1610  Juliet Rude. Rubin Payor, MD 12/11/11 [phone]

## 2011-12-11 NOTE — ED Notes (Signed)
Patient complaining of "feeling like I'm having a panic attack. My hands, feet, and head are tingling." States it happens when she takes her medicine but is unsure of what medicine she is taking.

## 2011-12-12 NOTE — ED Notes (Signed)
Patient refused to let me take out her IV. States "no, I just want to leave." Security called and at bedside at this time.

## 2011-12-12 NOTE — ED Notes (Signed)
Attempted to get patient to talk to telepsych and patient refused. Patient got out of bed and turned the computer screen around so that the psychiatrist could not see her. Patient states "I don't want to talk to someone like that. I want to talk face to face. I just want to go home." Advised Dr Colon Branch.

## 2011-12-12 NOTE — ED Notes (Signed)
Patient states she has no ride home. Officer stated to patient that he would attempt to find patient a ride home.

## 2011-12-12 NOTE — BH Assessment (Addendum)
Assessment Note   Jennifer Flores is an 46 y.o. female. Patient frequently is seen in the emergency department for various complaints on a regular basis. She reported to the ED physician that she has just gotten out of inpatient treatment but her medications are not correct. The original plan was for the patient to see tele-psychiatry but when the computer was taken into her room; she refused to cooperate. She stated that she was tired and that she didn't want to talk to the computer doctor. When it was explained to her that this hospital did not have on site psychiatrist and this was her only options to see about changing her medications, which is what she was requesting; she flat out refused to cooperate;  Saying she was tired and wanted to sleep. When she was prompted again to cooperate to get the treatment she needed, she refused to speak to the RN or myself.  She refused to complete behavioral health assessment  She then got up and started loudly talking to counselor, stating, "You got me up, you rushed me, now give me my papers so I can leave." When she was confronted that no one was trying to rush her or have her leave, she became agitated and demanded to leave. Discussed with Dr. Colon Branch; who stated she would discharge the patient but that she was discharging two other patients first and she would have to wait. She then refused to cooperate with the RN on taking her IV out of her hand. Security and [employer] had to restrain her to get the IV out of her hand. She then stated she didn't have a ride home. [employer] arranged for her to get a ride home.   Patient is d/c back to her treating provider. Patient refused services.   Axis I: Bipolar, mixed; Polysubstance abuse by history Axis II: Deferred  Axis III: See below Axis IV: Numerous social and environmental stressors Axis V: GAF 50  Past Medical History:  Past Medical History  Diagnosis Date  . Bipolar disease, chronic 2000    Dr. Betti Cruz /   .  Suicide attempt   . Spider bite 2008    Seen in the ED   . Narcotic addiction   . Alcohol addiction   . Scabies   . Anxiety   . Panic attack   . Palpitations   . Depression     Past Surgical History  Procedure Date  . Cesarean section 2004  . Extraction of teeth 2010    Family History:  Family History  Problem Relation Age of Onset  . Heart disease Mother   . Hypertension Mother   . Diabetes Father   . Anxiety disorder Father   . Anxiety disorder Sister     Social History:  reports that she has been smoking Cigarettes.  She has been smoking about 2 packs per day. She does not have any smokeless tobacco history on file. She reports that she does not drink alcohol or use illicit drugs.  Additional Social History:     CIWA: CIWA-Ar BP: 92/54 mmHg Pulse Rate: 82  COWS:    Allergies: No Known Allergies  Home Medications:  (Not in a hospital admission)  OB/GYN Status:  Patient's last menstrual period was 11/15/2011.  General Assessment Data Location of Assessment: [facility] ACT Assessment: Yes Living Arrangements: Alone Can pt return to current living arrangement?: Yes Admission Status: Voluntary Is patient capable of signing voluntary admission?: Yes Transfer from: Acute Hospital Referral Source: MD  Education Status Is patient currently in school?: No  Risk to self Suicidal Ideation: No Suicidal Intent: No Is patient at risk for suicide?: No Suicidal Plan?: No Substance abuse history and/or treatment for substance abuse?: No  Risk to Others Homicidal Ideation: No Thoughts of Harm to Others: No     Mental Status Report Appear/Hygiene: Disheveled Speech: Logical/coherent Mood: Depressed Affect: Unable to Assess                           Values / Beliefs Cultural Requests During Hospitalization: None Spiritual Requests During Hospitalization: None        Additional Information 1:1 In Past 12 Months?: No CIRT Risk: No Elopement  Risk: No Does patient have medical clearance?: Yes     Disposition:  Disposition Disposition of Patient: Outpatient treatment;Other dispositions  On Site Evaluation by:  Dr. Colon Branch Reviewed with Physician:  Dr. Lauris Poag, Maximiano Coss H 12/12/2011 2:20 AM

## 2011-12-13 NOTE — Progress Notes (Signed)
12/12/11 2330 Assumed care/disposition of patient. She is awaiting telepsych consult to help with medications.. Patient was recently hospitalized for depression and changes made to medications. NO w states she has panic attacks associated with itching when taking her medicines.  1610 Tele psych ready. 0230 Patient refused to talk with tele psychiatrist. She is demanding to be allowed to go home. She will follow up with [facility] on her own. 30 Resource police officer has arranged for a ride home. Patient discharged home.

## 2012-05-15 ENCOUNTER — Ambulatory Visit: Payer: Medicare Other | Admitting: Family Medicine

## 2012-05-18 ENCOUNTER — Ambulatory Visit: Payer: Medicare Other | Admitting: Family Medicine

## 2012-05-24 ENCOUNTER — Ambulatory Visit: Payer: Medicare Other | Admitting: Family Medicine

## 2012-07-31 ENCOUNTER — Encounter (HOSPITAL_COMMUNITY): Payer: Self-pay

## 2012-07-31 ENCOUNTER — Emergency Department (HOSPITAL_COMMUNITY)
Admission: EM | Admit: 2012-07-31 | Discharge: 2012-07-31 | Disposition: A | Discharge status: Home / Self Care | Payer: Medicare Other | Attending: Emergency Medicine | Admitting: Emergency Medicine

## 2012-07-31 DIAGNOSIS — F410 Panic disorder [episodic paroxysmal anxiety] without agoraphobia: Secondary | ICD-10-CM | POA: Insufficient documentation

## 2012-07-31 DIAGNOSIS — F319 Bipolar disorder, unspecified: Secondary | ICD-10-CM | POA: Insufficient documentation

## 2012-07-31 DIAGNOSIS — J45901 Unspecified asthma with (acute) exacerbation: Secondary | ICD-10-CM | POA: Insufficient documentation

## 2012-07-31 DIAGNOSIS — Z79899 Other long term (current) drug therapy: Secondary | ICD-10-CM | POA: Insufficient documentation

## 2012-07-31 DIAGNOSIS — R110 Nausea: Secondary | ICD-10-CM | POA: Insufficient documentation

## 2012-07-31 DIAGNOSIS — R0602 Shortness of breath: Secondary | ICD-10-CM | POA: Insufficient documentation

## 2012-07-31 DIAGNOSIS — Z8619 Personal history of other infectious and parasitic diseases: Secondary | ICD-10-CM | POA: Insufficient documentation

## 2012-07-31 DIAGNOSIS — F17200 Nicotine dependence, unspecified, uncomplicated: Secondary | ICD-10-CM | POA: Insufficient documentation

## 2012-07-31 DIAGNOSIS — R062 Wheezing: Secondary | ICD-10-CM

## 2012-07-31 DIAGNOSIS — Z87828 Personal history of other (healed) physical injury and trauma: Secondary | ICD-10-CM | POA: Insufficient documentation

## 2012-07-31 MED ORDER — ONDANSETRON 8 MG PO TBDP
8.0000 mg | ORAL_TABLET | Freq: Once | ORAL | Status: AC
  Administered 2012-07-31: 8 mg via ORAL
  Filled 2012-07-31: qty 1

## 2012-07-31 MED ORDER — ONDANSETRON 4 MG PO TBDP: 4.0000 mg | ORAL_TABLET | Freq: Three times a day (TID) | ORAL | Status: DC | PRN

## 2012-07-31 MED ORDER — ALBUTEROL SULFATE (5 MG/ML) 0.5% IN NEBU
2.5000 mg | INHALATION_SOLUTION | Freq: Once | RESPIRATORY_TRACT | Status: AC
  Administered 2012-07-31: 2.5 mg via RESPIRATORY_TRACT
  Filled 2012-07-31: qty 0.5
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
## 2012-07-31 NOTE — ED Notes (Signed)
Was seen a couple of days ago, diagnosed with bronchitis. I was trying to sleep and I had a lot of wheezing and sob. Patient smokes.

## 2013-06-27 IMAGING — CT CT HEAD W/O CM
1 series · 16 of 30 positions shown, 20 images · non-contrast
Comparison: None.

CLINICAL DATA: Emesis, dizziness, abdominal pain, and right arm
tingling.  Anxiety and bipolar disease.

CT HEAD WITHOUT CONTRAST
TECHNIQUE: Contiguous axial images were obtained from the base of
the skull through the vertex without contrast.

[Series 2: headseq 4.8 h37s · axial · 0.45mm/px · z∈[+59,+194]mm · 16 of 30 slices shown, 20 images]
[im 2/30  brain]
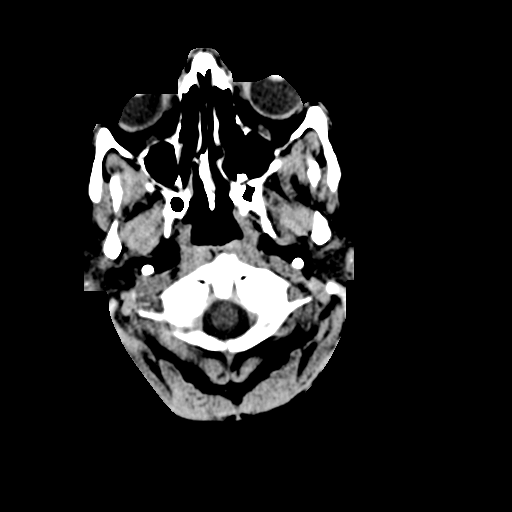
[im 2/30  bone]
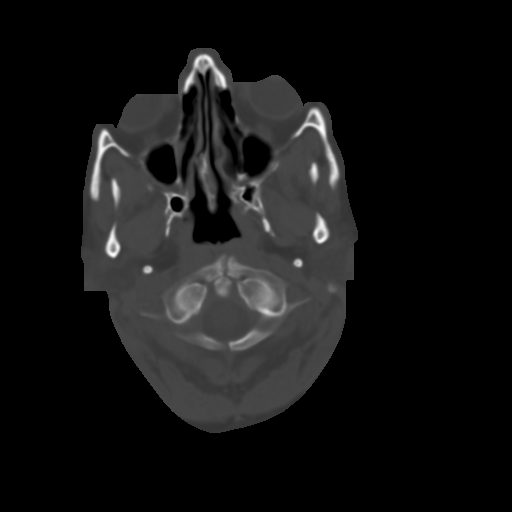
[im 4/30  brain]
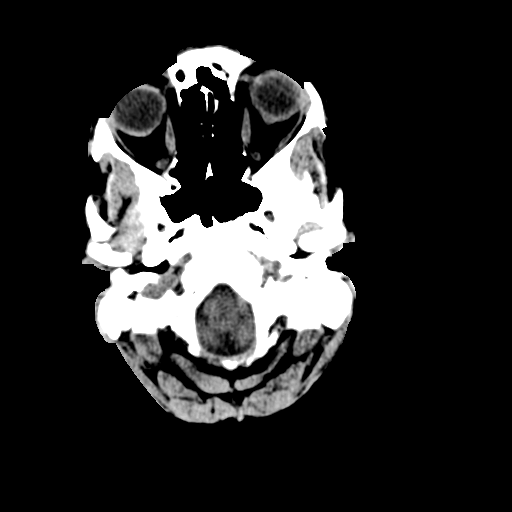
[im 6/30  brain]
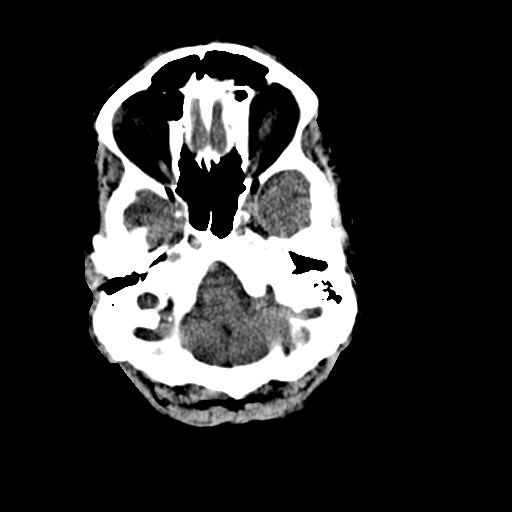
[im 8/30  brain]
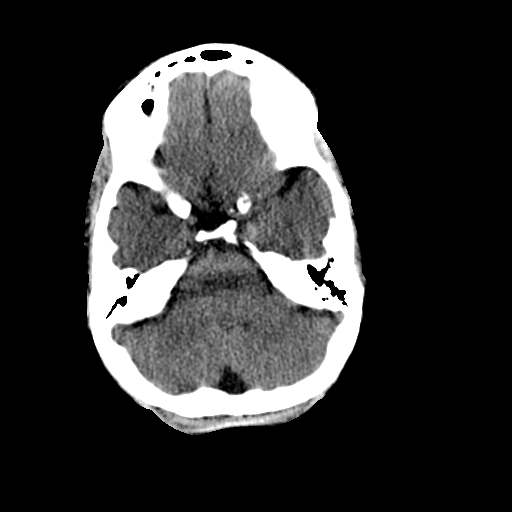
[im 9/30  brain]
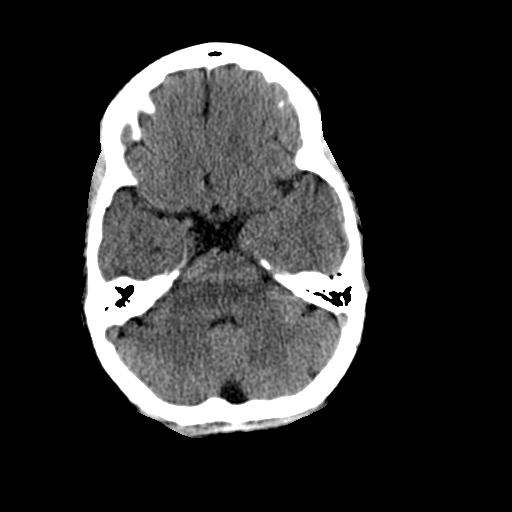
[im 9/30  bone]
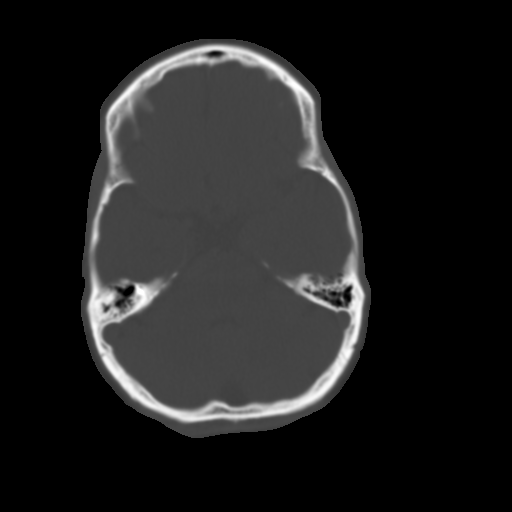
[im 11/30  brain]
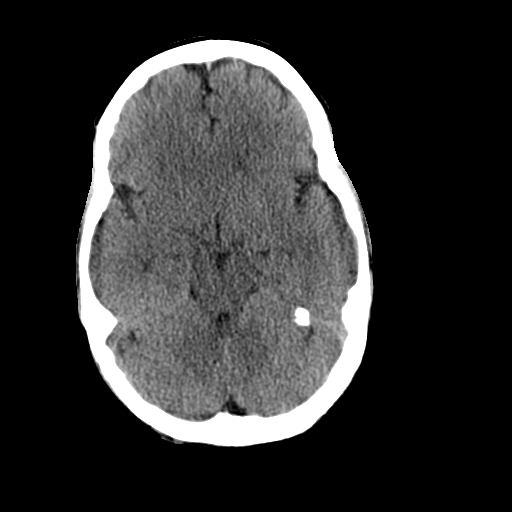
[im 13/30  brain]
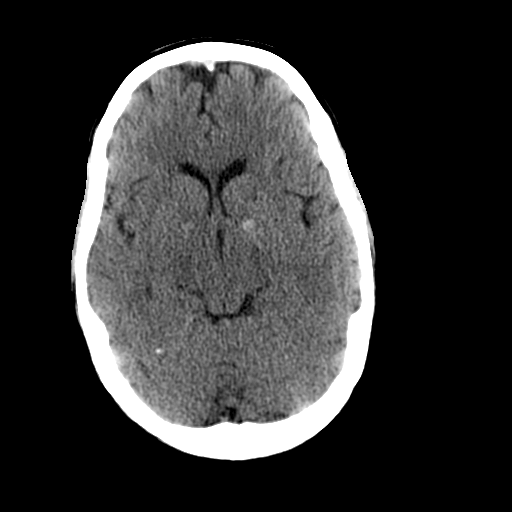
[im 15/30  brain]
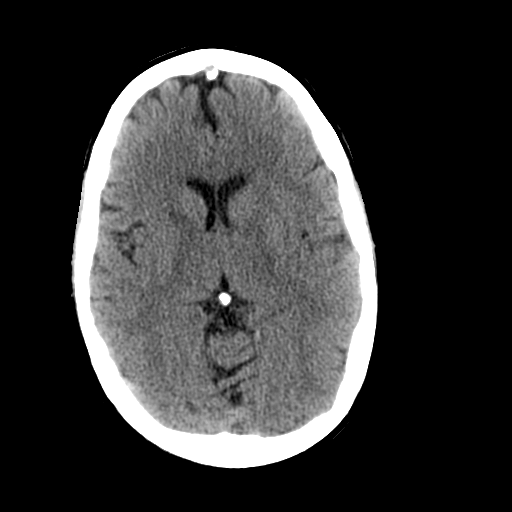
[im 16/30  brain]
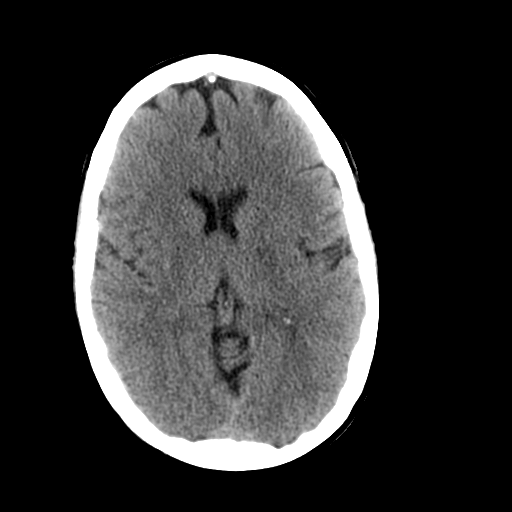
[im 16/30  bone]
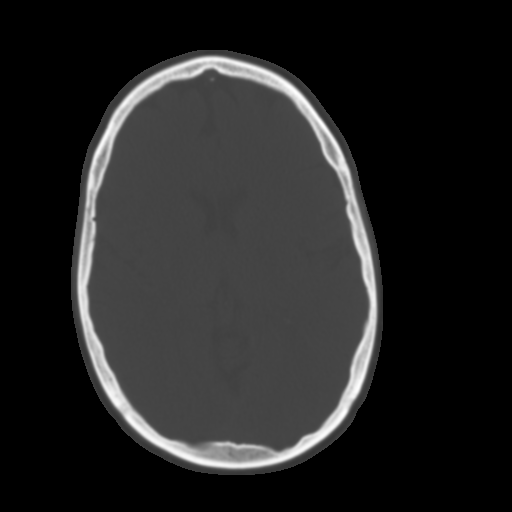
[im 18/30  brain]
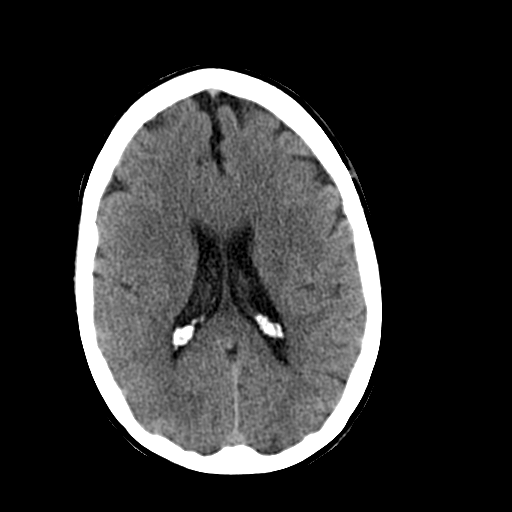
[im 20/30  brain]
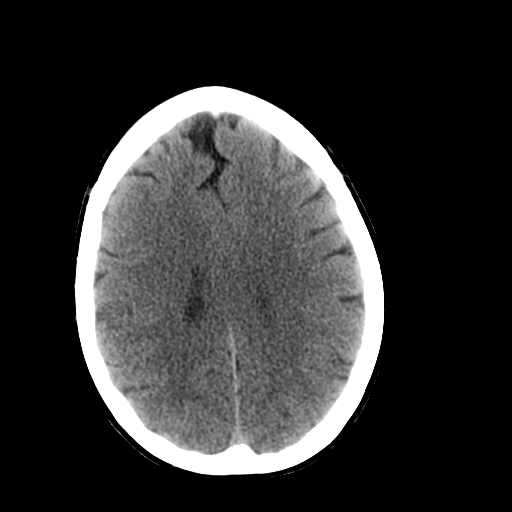
[im 22/30  brain]
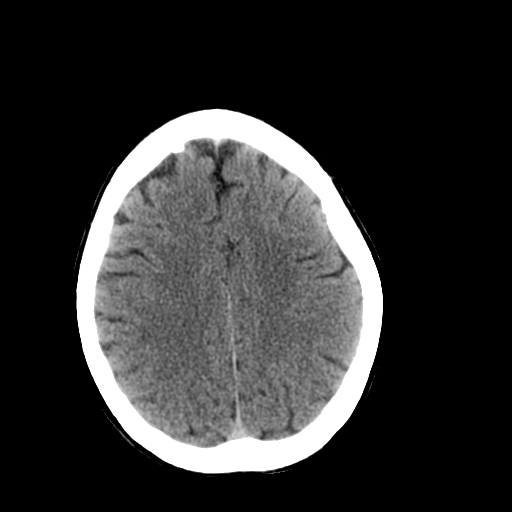
[im 23/30  brain]
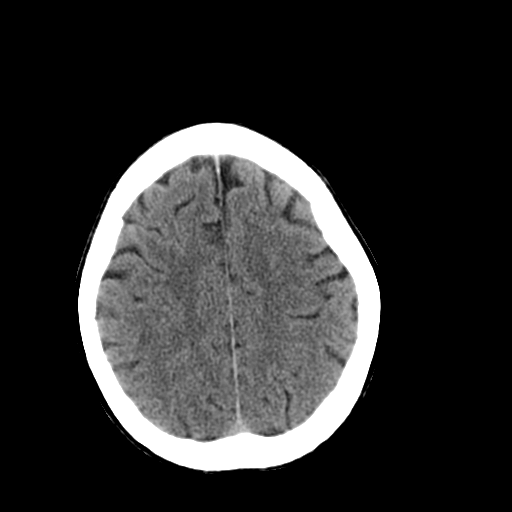
[im 23/30  bone]
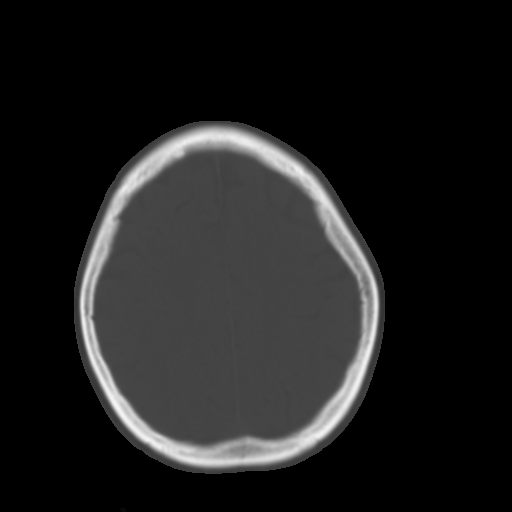
[im 25/30  brain]
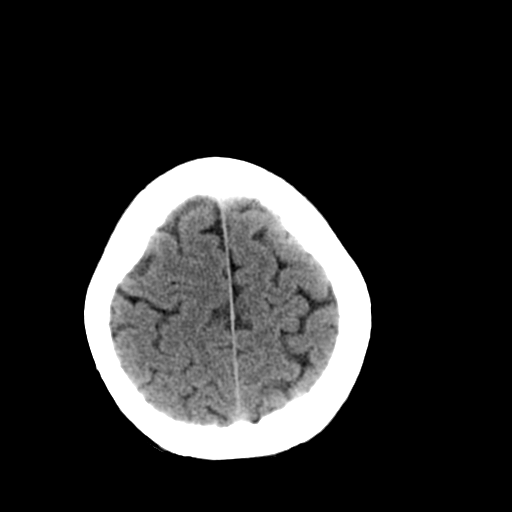
[im 27/30  brain]
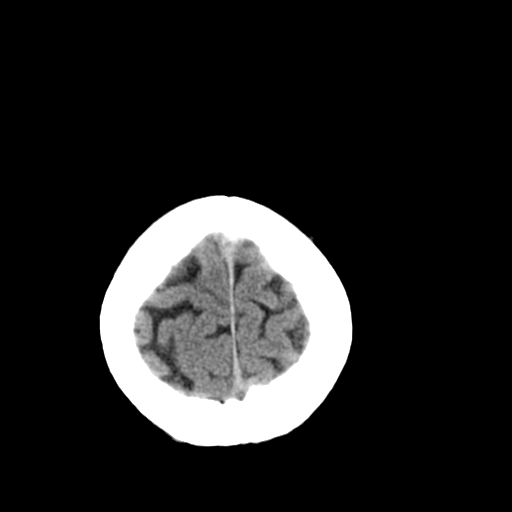
[im 29/30  brain]
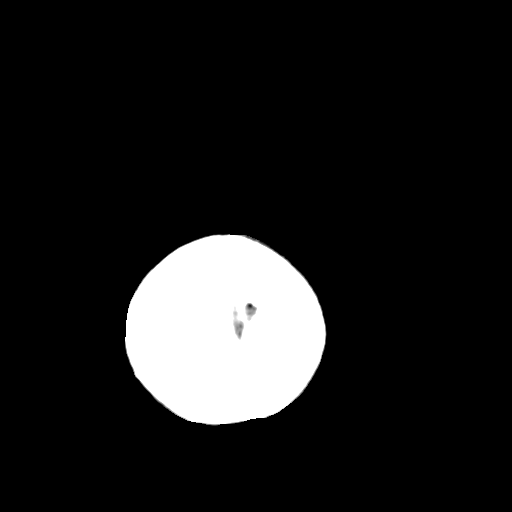

[16 of 30 positions shown; findings below may reference images not displayed]

FINDINGS: Mild cerebral atrophy.  No ventricular dilatation.  No
mass effect or midline shift.  No abnormal extra-axial fluid
collections.  Calcification in the basal ganglia. Focal
calcifications along the tentorium, likely dystrophic.  Gray-white
matter junctions are distinct.  Basal cisterns are not effaced.  No
evidence of acute intracranial hemorrhage.  Vascular calcifications
with tortuosity and ectasia of the distal left internal carotid
artery.  No depressed skull fractures.  Visualized portions of the
mastoid air cells and paranasal sinuses are not opacified.
IMPRESSION: No acute intracranial abnormalities.

## 2013-07-27 ENCOUNTER — Ambulatory Visit (HOSPITAL_COMMUNITY)
Admission: RE | Admit: 2013-07-27 | Discharge: 2013-07-27 | Disposition: A | Discharge status: Home / Self Care | Payer: Medicare Other | Source: Ambulatory Visit | Attending: Internal Medicine | Admitting: Internal Medicine

## 2013-07-27 ENCOUNTER — Other Ambulatory Visit (HOSPITAL_COMMUNITY): Payer: Self-pay | Admitting: Internal Medicine

## 2013-07-27 DIAGNOSIS — M542 Cervicalgia: Secondary | ICD-10-CM

## 2013-07-27 DIAGNOSIS — M79601 Pain in right arm: Secondary | ICD-10-CM

## 2013-07-27 DIAGNOSIS — M25519 Pain in unspecified shoulder: Secondary | ICD-10-CM | POA: Insufficient documentation

## 2013-07-27 DIAGNOSIS — M47812 Spondylosis without myelopathy or radiculopathy, cervical region: Secondary | ICD-10-CM | POA: Insufficient documentation

## 2013-08-01 ENCOUNTER — Other Ambulatory Visit (HOSPITAL_COMMUNITY): Payer: Self-pay | Admitting: Internal Medicine

## 2013-08-01 DIAGNOSIS — M25511 Pain in right shoulder: Secondary | ICD-10-CM

## 2013-08-01 DIAGNOSIS — M542 Cervicalgia: Secondary | ICD-10-CM

## 2013-08-02 ENCOUNTER — Ambulatory Visit (HOSPITAL_COMMUNITY)
Admission: RE | Admit: 2013-08-02 | Discharge: 2013-08-02 | Disposition: A | Discharge status: Home / Self Care | Payer: Medicare Other | Source: Ambulatory Visit | Attending: Internal Medicine | Admitting: Internal Medicine

## 2013-08-02 ENCOUNTER — Encounter (HOSPITAL_COMMUNITY): Payer: Self-pay

## 2013-08-02 DIAGNOSIS — R209 Unspecified disturbances of skin sensation: Secondary | ICD-10-CM | POA: Insufficient documentation

## 2013-08-02 DIAGNOSIS — M4802 Spinal stenosis, cervical region: Secondary | ICD-10-CM | POA: Insufficient documentation

## 2013-08-02 DIAGNOSIS — M542 Cervicalgia: Secondary | ICD-10-CM | POA: Insufficient documentation

## 2013-08-02 DIAGNOSIS — M79609 Pain in unspecified limb: Secondary | ICD-10-CM | POA: Insufficient documentation

## 2013-08-02 DIAGNOSIS — M5020 Other cervical disc displacement, unspecified cervical region: Secondary | ICD-10-CM | POA: Insufficient documentation

## 2013-08-02 DIAGNOSIS — M25511 Pain in right shoulder: Secondary | ICD-10-CM

## 2014-06-27 DIAGNOSIS — F310 Bipolar disorder, current episode hypomanic: Secondary | ICD-10-CM | POA: Diagnosis not present

## 2014-07-10 DIAGNOSIS — R0602 Shortness of breath: Secondary | ICD-10-CM | POA: Diagnosis not present

## 2014-07-10 DIAGNOSIS — M542 Cervicalgia: Secondary | ICD-10-CM | POA: Diagnosis not present

## 2014-07-10 DIAGNOSIS — F99 Mental disorder, not otherwise specified: Secondary | ICD-10-CM | POA: Diagnosis not present

## 2014-07-10 DIAGNOSIS — S0990XA Unspecified injury of head, initial encounter: Secondary | ICD-10-CM | POA: Diagnosis not present

## 2014-07-10 DIAGNOSIS — S3991XA Unspecified injury of abdomen, initial encounter: Secondary | ICD-10-CM | POA: Diagnosis not present

## 2014-07-10 DIAGNOSIS — S99911A Unspecified injury of right ankle, initial encounter: Secondary | ICD-10-CM | POA: Diagnosis not present

## 2014-07-10 DIAGNOSIS — S59901A Unspecified injury of right elbow, initial encounter: Secondary | ICD-10-CM | POA: Diagnosis not present

## 2014-07-10 DIAGNOSIS — S299XXA Unspecified injury of thorax, initial encounter: Secondary | ICD-10-CM | POA: Diagnosis not present

## 2014-07-10 DIAGNOSIS — F419 Anxiety disorder, unspecified: Secondary | ICD-10-CM | POA: Diagnosis not present

## 2014-07-10 DIAGNOSIS — R51 Headache: Secondary | ICD-10-CM | POA: Diagnosis not present

## 2014-07-10 DIAGNOSIS — S199XXA Unspecified injury of neck, initial encounter: Secondary | ICD-10-CM | POA: Diagnosis not present

## 2014-08-22 DIAGNOSIS — F4310 Post-traumatic stress disorder, unspecified: Secondary | ICD-10-CM | POA: Diagnosis not present

## 2014-12-31 DIAGNOSIS — R079 Chest pain, unspecified: Secondary | ICD-10-CM | POA: Diagnosis not present

## 2014-12-31 DIAGNOSIS — R918 Other nonspecific abnormal finding of lung field: Secondary | ICD-10-CM | POA: Diagnosis not present

## 2015-01-09 DIAGNOSIS — F609 Personality disorder, unspecified: Secondary | ICD-10-CM | POA: Diagnosis not present

## 2015-03-27 DIAGNOSIS — J209 Acute bronchitis, unspecified: Secondary | ICD-10-CM | POA: Diagnosis not present

## 2015-03-27 DIAGNOSIS — J029 Acute pharyngitis, unspecified: Secondary | ICD-10-CM | POA: Diagnosis not present

## 2015-03-27 DIAGNOSIS — F17200 Nicotine dependence, unspecified, uncomplicated: Secondary | ICD-10-CM | POA: Diagnosis not present

## 2015-04-25 DIAGNOSIS — I6789 Other cerebrovascular disease: Secondary | ICD-10-CM | POA: Diagnosis not present

## 2015-04-25 DIAGNOSIS — F4489 Other dissociative and conversion disorders: Secondary | ICD-10-CM | POA: Diagnosis not present

## 2015-05-16 DIAGNOSIS — H2522 Age-related cataract, morgagnian type, left eye: Secondary | ICD-10-CM | POA: Diagnosis not present

## 2015-08-07 DIAGNOSIS — F17200 Nicotine dependence, unspecified, uncomplicated: Secondary | ICD-10-CM | POA: Diagnosis not present

## 2015-08-07 DIAGNOSIS — Z79899 Other long term (current) drug therapy: Secondary | ICD-10-CM | POA: Diagnosis not present

## 2015-08-07 DIAGNOSIS — F319 Bipolar disorder, unspecified: Secondary | ICD-10-CM | POA: Diagnosis not present

## 2015-08-07 DIAGNOSIS — L989 Disorder of the skin and subcutaneous tissue, unspecified: Secondary | ICD-10-CM | POA: Diagnosis not present

## 2015-08-28 DIAGNOSIS — F29 Unspecified psychosis not due to a substance or known physiological condition: Secondary | ICD-10-CM | POA: Diagnosis not present

## 2015-08-28 DIAGNOSIS — R531 Weakness: Secondary | ICD-10-CM | POA: Diagnosis not present

## 2015-08-28 DIAGNOSIS — R45851 Suicidal ideations: Secondary | ICD-10-CM | POA: Diagnosis not present

## 2015-08-29 DIAGNOSIS — F314 Bipolar disorder, current episode depressed, severe, without psychotic features: Secondary | ICD-10-CM | POA: Diagnosis not present

## 2015-09-01 DIAGNOSIS — F314 Bipolar disorder, current episode depressed, severe, without psychotic features: Secondary | ICD-10-CM | POA: Diagnosis not present

## 2016-02-19 DIAGNOSIS — J0100 Acute maxillary sinusitis, unspecified: Secondary | ICD-10-CM | POA: Diagnosis not present

## 2016-02-19 DIAGNOSIS — J209 Acute bronchitis, unspecified: Secondary | ICD-10-CM | POA: Diagnosis not present

## 2016-02-19 DIAGNOSIS — F17200 Nicotine dependence, unspecified, uncomplicated: Secondary | ICD-10-CM | POA: Diagnosis not present

## 2016-03-11 DIAGNOSIS — F609 Personality disorder, unspecified: Secondary | ICD-10-CM | POA: Diagnosis not present

## 2016-04-15 ENCOUNTER — Other Ambulatory Visit (HOSPITAL_COMMUNITY): Payer: Self-pay | Admitting: Internal Medicine

## 2016-04-15 DIAGNOSIS — J40 Bronchitis, not specified as acute or chronic: Secondary | ICD-10-CM | POA: Diagnosis not present

## 2016-04-15 DIAGNOSIS — E785 Hyperlipidemia, unspecified: Secondary | ICD-10-CM | POA: Diagnosis not present

## 2016-04-15 DIAGNOSIS — F319 Bipolar disorder, unspecified: Secondary | ICD-10-CM | POA: Diagnosis not present

## 2016-04-15 DIAGNOSIS — L309 Dermatitis, unspecified: Secondary | ICD-10-CM | POA: Diagnosis not present

## 2016-04-15 DIAGNOSIS — Z23 Encounter for immunization: Secondary | ICD-10-CM | POA: Diagnosis not present

## 2016-04-15 DIAGNOSIS — Z0000 Encounter for general adult medical examination without abnormal findings: Secondary | ICD-10-CM | POA: Diagnosis not present

## 2016-04-15 DIAGNOSIS — F17200 Nicotine dependence, unspecified, uncomplicated: Secondary | ICD-10-CM | POA: Diagnosis not present

## 2016-04-15 DIAGNOSIS — Z1231 Encounter for screening mammogram for malignant neoplasm of breast: Secondary | ICD-10-CM

## 2016-04-15 DIAGNOSIS — R7309 Other abnormal glucose: Secondary | ICD-10-CM | POA: Diagnosis not present

## 2016-04-28 ENCOUNTER — Ambulatory Visit (HOSPITAL_COMMUNITY): Payer: Medicare Other

## 2016-05-07 ENCOUNTER — Encounter: Payer: Self-pay | Admitting: *Deleted

## 2016-05-17 ENCOUNTER — Other Ambulatory Visit: Payer: Self-pay | Admitting: Women's Health

## 2016-05-19 ENCOUNTER — Ambulatory Visit (HOSPITAL_COMMUNITY): Payer: Medicare Other

## 2016-05-27 ENCOUNTER — Ambulatory Visit (HOSPITAL_COMMUNITY): Payer: Medicare Other

## 2016-05-31 ENCOUNTER — Ambulatory Visit (HOSPITAL_COMMUNITY): Payer: Medicare Other

## 2016-06-04 DIAGNOSIS — F609 Personality disorder, unspecified: Secondary | ICD-10-CM | POA: Diagnosis not present

## 2016-06-07 ENCOUNTER — Other Ambulatory Visit: Payer: Medicare Other | Admitting: Women's Health

## 2016-10-15 DIAGNOSIS — F609 Personality disorder, unspecified: Secondary | ICD-10-CM | POA: Diagnosis not present

## 2017-03-23 DIAGNOSIS — H2513 Age-related nuclear cataract, bilateral: Secondary | ICD-10-CM | POA: Diagnosis not present

## 2017-04-12 DIAGNOSIS — H2522 Age-related cataract, morgagnian type, left eye: Secondary | ICD-10-CM | POA: Diagnosis not present

## 2017-04-12 DIAGNOSIS — H25041 Posterior subcapsular polar age-related cataract, right eye: Secondary | ICD-10-CM | POA: Diagnosis not present

## 2017-04-12 DIAGNOSIS — H2511 Age-related nuclear cataract, right eye: Secondary | ICD-10-CM | POA: Diagnosis not present

## 2017-04-14 NOTE — Patient Instructions (Signed)
Your procedure is scheduled on: 04/19/2017   Report to [facility] at  930   AM.  Call this number if you have problems the morning of surgery: [phone]   Do not eat food or drink liquids :After Midnight.      Take these medicines the morning of surgery with A SIP OF WATER: wellbutrin, neurontin, zofran, zoloft. Use your inhaler before you come.   Do not wear jewelry, make-up or nail polish.  Do not wear lotions, powders, or perfumes. You may wear deodorant.  Do not shave 48 hours prior to surgery.  Do not bring valuables to the hospital.  Contacts, dentures or bridgework may not be worn into surgery.  Leave suitcase in the car. After surgery it may be brought to your room.  For patients admitted to the hospital, checkout time is 11:00 AM the day of discharge.   Patients discharged the day of surgery will not be allowed to drive home.  :     Please read over the following fact sheets that you were given: Coughing and Deep Breathing, Surgical Site Infection Prevention, Anesthesia Post-op Instructions and Care and Recovery After Surgery    Cataract A cataract is a clouding of the lens of the eye. When a lens becomes cloudy, vision is reduced based on the degree and nature of the clouding. Many cataracts reduce vision to some degree. Some cataracts make people more near-sighted as they develop. Other cataracts increase glare. Cataracts that are ignored and become worse can sometimes look white. The white color can be seen through the pupil. CAUSES   Aging. However, cataracts may occur at any age, even in newborns.   Certain drugs.   Trauma to the eye.   Certain diseases such as diabetes.   Specific eye diseases such as chronic inflammation inside the eye or a sudden attack of a rare form of glaucoma.   Inherited or acquired medical problems.  SYMPTOMS   Gradual, progressive drop in vision in the affected eye.   Severe, rapid visual loss. This most often happens when trauma is  the cause.  DIAGNOSIS  To detect a cataract, an eye doctor examines the lens. Cataracts are best diagnosed with an exam of the eyes with the pupils enlarged (dilated) by drops.  TREATMENT  For an early cataract, vision may improve by using different eyeglasses or stronger lighting. If that does not help your vision, surgery is the only effective treatment. A cataract needs to be surgically removed when vision loss interferes with your everyday activities, such as driving, reading, or watching TV. A cataract may also have to be removed if it prevents examination or treatment of another eye problem. Surgery removes the cloudy lens and usually replaces it with a substitute lens (intraocular lens, IOL).  At a time when both you and your doctor agree, the cataract will be surgically removed. If you have cataracts in both eyes, only one is usually removed at a time. This allows the operated eye to heal and be out of danger from any possible problems after surgery (such as infection or poor wound healing). In rare cases, a cataract may be doing damage to your eye. In these cases, your caregiver may advise surgical removal right away. The vast majority of people who have cataract surgery have better vision afterward. HOME CARE INSTRUCTIONS  If you are not planning surgery, you may be asked to do the following:  Use different eyeglasses.   Use stronger or brighter lighting.  Ask your eye doctor about reducing your medicine dose or changing medicines if it is thought that a medicine caused your cataract. Changing medicines does not make the cataract go away on its own.   Become familiar with your surroundings. Poor vision can lead to injury. Avoid bumping into things on the affected side. You are at a higher risk for tripping or falling.   Exercise extreme care when driving or operating machinery.   Wear sunglasses if you are sensitive to bright light or experiencing problems with glare.  SEEK IMMEDIATE  MEDICAL CARE IF:   You have a worsening or sudden vision loss.   You notice redness, swelling, or increasing pain in the eye.   You have a fever.  Document Released: 04/26/2005 Document Revised: 04/15/2011 Document Reviewed: 12/18/2010 [facility] Patient Information 2012 [place].PATIENT INSTRUCTIONS POST-ANESTHESIA  IMMEDIATELY FOLLOWING SURGERY:  Do not drive or operate machinery for the first twenty four hours after surgery.  Do not make any important decisions for twenty four hours after surgery or while taking narcotic pain medications or sedatives.  If you develop intractable nausea and vomiting or a severe headache please notify your doctor immediately.  FOLLOW-UP:  Please make an appointment with your surgeon as instructed. You do not need to follow up with anesthesia unless specifically instructed to do so.  WOUND CARE INSTRUCTIONS (if applicable):  Keep a dry clean dressing on the anesthesia/puncture wound site if there is drainage.  Once the wound has quit draining you may leave it open to air.  Generally you should leave the bandage intact for twenty four hours unless there is drainage.  If the epidural site drains for more than 36-48 hours please call the anesthesia department.  QUESTIONS?:  Please feel free to call your physician or the hospital operator if you have any questions, and they will be happy to assist you.

## 2017-04-15 ENCOUNTER — Encounter (HOSPITAL_COMMUNITY)
Admission: RE | Admit: 2017-04-15 | Discharge: 2017-04-15 | Disposition: A | Discharge status: Home / Self Care | Payer: Medicare Other | Source: Ambulatory Visit | Attending: Ophthalmology | Admitting: Ophthalmology

## 2017-04-15 ENCOUNTER — Encounter (HOSPITAL_COMMUNITY): Payer: Self-pay

## 2017-04-15 ENCOUNTER — Other Ambulatory Visit: Payer: Self-pay

## 2017-04-15 DIAGNOSIS — Z01810 Encounter for preprocedural cardiovascular examination: Secondary | ICD-10-CM | POA: Insufficient documentation

## 2017-04-15 DIAGNOSIS — H2522 Age-related cataract, morgagnian type, left eye: Secondary | ICD-10-CM | POA: Insufficient documentation

## 2017-04-15 DIAGNOSIS — R000 Tachycardia, unspecified: Secondary | ICD-10-CM | POA: Insufficient documentation

## 2017-04-15 DIAGNOSIS — Z01812 Encounter for preprocedural laboratory examination: Secondary | ICD-10-CM | POA: Insufficient documentation

## 2017-04-15 HISTORY — DX: Chronic obstructive pulmonary disease, unspecified: J44.9

## 2017-04-15 HISTORY — DX: Essential (primary) hypertension: I10

## 2017-04-15 HISTORY — DX: Personal history of urinary calculi: Z87.442

## 2017-04-15 LAB — BASIC METABOLIC PANEL
Anion gap: 12 (ref 5–15)
BUN: 9 mg/dL (ref 6–20)
CO2: 23 mmol/L (ref 22–32)
Calcium: 9.2 mg/dL (ref 8.9–10.3)
Chloride: 98 mmol/L — ABNORMAL LOW (ref 101–111)
Creatinine, Ser: 0.64 mg/dL (ref 0.44–1.00)
GFR calc Af Amer: >60 mL/min (ref >60)
GFR calc non Af Amer: >60 mL/min (ref >60)
Glucose, Bld: 133 mg/dL — ABNORMAL HIGH (ref 65–99)
Potassium: 3.9 mmol/L (ref 3.5–5.1)
Sodium: 133 mmol/L — ABNORMAL LOW (ref 135–145)

## 2017-04-15 LAB — CBC WITH DIFFERENTIAL/PLATELET
Basophils Absolute: 0.1 10*3/uL (ref 0.0–0.1)
Basophils Relative: 1 %
Eosinophils Absolute: 0.1 10*3/uL (ref 0.0–0.7)
Eosinophils Relative: 2 %
HCT: 44.2 % (ref 36.0–46.0)
Hemoglobin: 14.6 g/dL (ref 12.0–15.0)
Lymphocytes Relative: 30 %
Lymphs Abs: 2.1 10*3/uL (ref 0.7–4.0)
MCH: 33.3 pg (ref 26.0–34.0)
MCHC: 33 g/dL (ref 30.0–36.0)
MCV: 100.7 fL — ABNORMAL HIGH (ref 78.0–100.0)
Monocytes Absolute: 0.5 10*3/uL (ref 0.1–1.0)
Monocytes Relative: 7 %
Neutro Abs: 4.4 10*3/uL (ref 1.7–7.7)
Neutrophils Relative %: 60 %
Platelets: 258 10*3/uL (ref 150–400)
RBC: 4.39 MIL/uL (ref 3.87–5.11)
RDW: 13.8 % (ref 11.5–15.5)
WBC: 7.1 10*3/uL (ref 4.0–10.5)

## 2017-04-15 NOTE — Pre-Procedure Instructions (Signed)
Patient in for PAT. BP 152/114. Very emotional, states"'I got very angry yesterday about some things". When questioned about BP, states she has seen her PCP several times "since the summer and he wont put me on no medication for my blood pressure. Talked to patient at length about importance of BP being lower for surgery due to major health risks involved. Contacted Dr Letitia NeriFanta's office and "He is gone for the day". His nurse Malachi BondsIda, states that patient should go to ED. Instructed patient on this and she refuses to go to ED. Waynard EdwardsBarbara Morris, and Dr Veatrice KellsShultz in to stress importance of lower BP and decreasing stressors to patient. BP now 139/90. We will continue with surgery pending BP on Tuesday.

## 2017-04-19 ENCOUNTER — Ambulatory Visit (HOSPITAL_COMMUNITY)
Admission: RE | Admit: 2017-04-19 | Discharge: 2017-04-19 | Disposition: A | Discharge status: Home / Self Care | Payer: Medicare Other | Source: Ambulatory Visit | Attending: Emergency Medicine | Admitting: Emergency Medicine

## 2017-04-19 ENCOUNTER — Ambulatory Visit (HOSPITAL_COMMUNITY): Payer: Medicare Other

## 2017-04-19 ENCOUNTER — Encounter (HOSPITAL_COMMUNITY): Payer: Self-pay | Admitting: Cardiology

## 2017-04-19 ENCOUNTER — Encounter (HOSPITAL_COMMUNITY)
Admission: RE | Disposition: A | Discharge status: Home / Self Care | Payer: Self-pay | Source: Ambulatory Visit | Attending: Emergency Medicine

## 2017-04-19 ENCOUNTER — Encounter (HOSPITAL_COMMUNITY): Payer: Self-pay | Admitting: Anesthesiology

## 2017-04-19 ENCOUNTER — Emergency Department (HOSPITAL_COMMUNITY)
Admission: EM | Admit: 2017-04-19 | Discharge: 2017-04-19 | Disposition: A | Discharge status: Home / Self Care | Payer: Medicare Other

## 2017-04-19 ENCOUNTER — Other Ambulatory Visit: Payer: Self-pay

## 2017-04-19 DIAGNOSIS — Z87442 Personal history of urinary calculi: Secondary | ICD-10-CM | POA: Diagnosis not present

## 2017-04-19 DIAGNOSIS — R0602 Shortness of breath: Secondary | ICD-10-CM | POA: Diagnosis not present

## 2017-04-19 DIAGNOSIS — F419 Anxiety disorder, unspecified: Secondary | ICD-10-CM | POA: Diagnosis not present

## 2017-04-19 DIAGNOSIS — Z79899 Other long term (current) drug therapy: Secondary | ICD-10-CM | POA: Diagnosis not present

## 2017-04-19 DIAGNOSIS — H269 Unspecified cataract: Secondary | ICD-10-CM | POA: Insufficient documentation

## 2017-04-19 DIAGNOSIS — Z880 Allergy status to penicillin: Secondary | ICD-10-CM | POA: Diagnosis not present

## 2017-04-19 DIAGNOSIS — R000 Tachycardia, unspecified: Secondary | ICD-10-CM | POA: Diagnosis not present

## 2017-04-19 DIAGNOSIS — Z8249 Family history of ischemic heart disease and other diseases of the circulatory system: Secondary | ICD-10-CM | POA: Diagnosis not present

## 2017-04-19 DIAGNOSIS — F319 Bipolar disorder, unspecified: Secondary | ICD-10-CM | POA: Insufficient documentation

## 2017-04-19 DIAGNOSIS — Z5309 Procedure and treatment not carried out because of other contraindication: Secondary | ICD-10-CM | POA: Diagnosis not present

## 2017-04-19 DIAGNOSIS — N920 Excessive and frequent menstruation with regular cycle: Secondary | ICD-10-CM | POA: Diagnosis not present

## 2017-04-19 DIAGNOSIS — I10 Essential (primary) hypertension: Secondary | ICD-10-CM | POA: Diagnosis not present

## 2017-04-19 DIAGNOSIS — Z818 Family history of other mental and behavioral disorders: Secondary | ICD-10-CM | POA: Diagnosis not present

## 2017-04-19 DIAGNOSIS — Z7951 Long term (current) use of inhaled steroids: Secondary | ICD-10-CM | POA: Insufficient documentation

## 2017-04-19 DIAGNOSIS — Z833 Family history of diabetes mellitus: Secondary | ICD-10-CM | POA: Diagnosis not present

## 2017-04-19 DIAGNOSIS — F17210 Nicotine dependence, cigarettes, uncomplicated: Secondary | ICD-10-CM | POA: Diagnosis not present

## 2017-04-19 DIAGNOSIS — J449 Chronic obstructive pulmonary disease, unspecified: Secondary | ICD-10-CM | POA: Insufficient documentation

## 2017-04-19 DIAGNOSIS — R05 Cough: Secondary | ICD-10-CM | POA: Diagnosis not present

## 2017-04-19 LAB — CBC WITH DIFFERENTIAL/PLATELET
Basophils Absolute: 0 10*3/uL (ref 0.0–0.1)
Basophils Relative: 0 %
Eosinophils Absolute: 0.1 10*3/uL (ref 0.0–0.7)
Eosinophils Relative: 1 %
HCT: 46.4 % — ABNORMAL HIGH (ref 36.0–46.0)
Hemoglobin: 14.9 g/dL (ref 12.0–15.0)
Lymphocytes Relative: 22 %
Lymphs Abs: 3.2 10*3/uL (ref 0.7–4.0)
MCH: 32.7 pg (ref 26.0–34.0)
MCHC: 32.1 g/dL (ref 30.0–36.0)
MCV: 102 fL — ABNORMAL HIGH (ref 78.0–100.0)
Monocytes Absolute: 0.7 10*3/uL (ref 0.1–1.0)
Monocytes Relative: 5 %
Neutro Abs: 10.6 10*3/uL — ABNORMAL HIGH (ref 1.7–7.7)
Neutrophils Relative %: 72 %
Platelets: 250 10*3/uL (ref 150–400)
RBC: 4.55 MIL/uL (ref 3.87–5.11)
RDW: 13.8 % (ref 11.5–15.5)
WBC: 14.6 10*3/uL — ABNORMAL HIGH (ref 4.0–10.5)

## 2017-04-19 LAB — COMPREHENSIVE METABOLIC PANEL
ALT: 58 U/L — ABNORMAL HIGH (ref 14–54)
AST: 59 U/L — ABNORMAL HIGH (ref 15–41)
Albumin: 4.2 g/dL (ref 3.5–5.0)
Alkaline Phosphatase: 110 U/L (ref 38–126)
Anion gap: 12 (ref 5–15)
BUN: 8 mg/dL (ref 6–20)
CO2: 24 mmol/L (ref 22–32)
Calcium: 9.3 mg/dL (ref 8.9–10.3)
Chloride: 101 mmol/L (ref 101–111)
Creatinine, Ser: 0.8 mg/dL (ref 0.44–1.00)
GFR calc Af Amer: >60 mL/min (ref >60)
GFR calc non Af Amer: >60 mL/min (ref >60)
Glucose, Bld: 133 mg/dL — ABNORMAL HIGH (ref 65–99)
Potassium: 4.1 mmol/L (ref 3.5–5.1)
Sodium: 137 mmol/L (ref 135–145)
Total Bilirubin: 0.4 mg/dL (ref 0.3–1.2)
Total Protein: 7.7 g/dL (ref 6.5–8.1)

## 2017-04-19 LAB — TROPONIN I: Troponin I: <0.03 ng/mL (ref <0.03)

## 2017-04-19 LAB — TSH: TSH: 0.855 u[IU]/mL (ref 0.350–4.500)

## 2017-04-19 SURGERY — PHACOEMULSIFICATION, CATARACT, WITH IOL INSERTION
Anesthesia: Monitor Anesthesia Care | Laterality: Left

## 2017-04-19 MED ORDER — SODIUM CHLORIDE 0.9 % IV BOLUS (SEPSIS)
1000.0000 mL | Freq: Once | INTRAVENOUS | Status: AC
  Administered 2017-04-19: 1000 mL via INTRAVENOUS

## 2017-04-19 NOTE — OR Nursing (Signed)
Cataract surgery cancelled for now.

## 2017-04-19 NOTE — H&P (Signed)
The patient was re examined and there is no change in the patients condition since the original H and P. 

## 2017-04-19 NOTE — ED Provider Notes (Signed)
[facility] EMERGENCY DEPARTMENT Provider Note   CSN: [id number] Arrival date & time: 04/19/17  1046     History   Chief Complaint Chief Complaint  Patient presents with  . Tachycardia    HPI Jennifer Flores is a 51 y.o. female.  Patient was to have eye surgery today and they found out she was tachycardic so they sent her down to the emergency room for evaluation.  Patient has no other complaints   The history is provided by the patient. No language interpreter was used.  Illness  This is a new problem. The current episode started 12 to 24 hours ago. The problem occurs constantly. The problem has not changed since onset.Pertinent negatives include no chest pain, no abdominal pain and no headaches. Nothing aggravates the symptoms. Nothing relieves the symptoms.    Past Medical History:  Diagnosis Date  . Alcohol addiction (HCC)   . Anxiety   . Bipolar disease, chronic (HCC) 2000   Dr. Betti Cruzeddy /   . Bronchitis   . COPD (chronic obstructive pulmonary disease) (HCC)   . Depression   . History of kidney stones   . Hypertension   . Narcotic addiction (HCC)   . Palpitations   . Panic attack   . Scabies   . Spider bite 2008   Seen in the ED   . Suicide attempt [facility](HCC)     Patient Active Problem List   Diagnosis Date Noted  . 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 Generalized anxiety disorder 11/22/2011  . DERMATOMYCOSIS 05/24/2010  . UNSPECIFIED CYSTITIS 05/22/2010  . AMENORRHEA 05/22/2010  . DERMATITIS 05/22/2010  . DISORDER OF BONE AND CARTILAGE UNSPECIFIED 05/22/2010  . VAGINITIS 02/26/2010  . ABSCESS, VULVA 02/26/2010  . LEG PAIN 02/26/2010  . OTITIS EXTERNA 07/11/2009  . ACQUIRED KERATODERMA 05/07/2009  . RASH AND OTHER NONSPECIFIC SKIN ERUPTION 05/07/2009  . ACUTE CYSTITIS 04/01/2009  . PELVIC INFLAMMATORY DISEASE 04/01/2009  . BIPOLAR DISORDER UNSPECIFIED 02/18/2009  . NICOTINE ADDICTION 02/18/2009    . DEPRESSION 02/18/2009  . ACUTE MAXILLARY SINUSITIS 02/18/2009  . POLYMENORRHEA 02/18/2009  . FATIGUE 02/18/2009    Past Surgical History:  Procedure Laterality Date  . CESAREAN SECTION  2004  . extraction of teeth  2010    OB History    No data available       Home Medications    Prior to Admission medications   Medication Sig Start Date End Date Taking? Authorizing Provider  albuterol (PROVENTIL HFA;VENTOLIN HFA) 108 (90 BASE) MCG/ACT inhaler Inhale 2 puffs into the lungs every 6 (six) hours as needed for wheezing.   Yes [provider]  Difluprednate 0.05 % EMUL Place 1 drop into both eyes 2 (two) times daily. 04/12/17  Yes [provider]  ofloxacin (OCUFLOX) 0.3 % ophthalmic solution Place 1 drop into both eyes 2 (two) times daily. 04/12/17  Yes [provider]  risperiDONE (RISPERDAL) 2 MG tablet Take 1 tablet (2 mg total) by mouth at bedtime. For mood stabilization 11/30/11 04/19/17 Yes Nelly RoutKumar, Archana, MD  traZODone (DESYREL) 50 MG tablet Take 25 mg by mouth at bedtime.   Yes [provider]    Family History Family History  Problem Relation Age of Onset  . Heart disease Mother   . Hypertension Mother   . Diabetes Father   . Anxiety disorder Father   . Anxiety disorder Sister   . Depression Maternal Grandmother     Social History Social History  Tobacco Use  . Smoking status: Current Every Day Smoker    Packs/day: 1.00    Years: 30.00    Pack years: 30.00    Types: Cigarettes  . Smokeless tobacco: Never Used  Substance Use Topics  . Alcohol use: Yes    Comment: 2--- 40 oz a day.  last drink last night before 8  . Drug use: No    Comment: patient said she quit approx 2 years ago narcotic addition     Allergies   Penicillins   Review of Systems Review of Systems  Constitutional: Negative for appetite change and fatigue.  HENT: Negative for congestion, ear discharge and sinus pressure.   Eyes: Negative for  discharge.  Respiratory: Negative for cough.   Cardiovascular: Negative for chest pain.       Tachycardia  Gastrointestinal: Negative for abdominal pain and diarrhea.  Genitourinary: Negative for frequency and hematuria.  Musculoskeletal: Negative for back pain.  Skin: Negative for rash.  Neurological: Negative for seizures and headaches.  Psychiatric/Behavioral: Negative for hallucinations.     Physical Exam Updated Vital Signs BP 112/85   Pulse (!) 115   Temp 98.7 F (37.1 C) (Oral)   Resp (!) 29   LMP 11/15/2011   SpO2 98%   Physical Exam  Constitutional: She is oriented to person, place, and time. She appears well-developed.  HENT:  Head: Normocephalic.  Eyes: Conjunctivae and EOM are normal. No scleral icterus.  Neck: Neck supple. No thyromegaly present.  Cardiovascular: Exam reveals no gallop and no friction rub.  No murmur heard. Tachycardia  Pulmonary/Chest: No stridor. She has no wheezes. She has no rales. She exhibits no tenderness.  Abdominal: She exhibits no distension. There is no tenderness. There is no rebound.  Musculoskeletal: Normal range of motion. She exhibits no edema.  Lymphadenopathy:    She has no cervical adenopathy.  Neurological: She is oriented to person, place, and time. She exhibits normal muscle tone. Coordination normal.  Skin: No rash noted. No erythema.  Psychiatric: She has a normal mood and affect. Her behavior is normal.     ED Treatments / Results  Labs (all labs ordered are listed, but only abnormal results are displayed) Labs Reviewed  CBC WITH DIFFERENTIAL/PLATELET - Abnormal; Notable for the following components:      Result Value   WBC 14.6 (*)    HCT 46.4 (*)    MCV 102.0 (*)    Neutro Abs 10.6 (*)    All other components within normal limits  COMPREHENSIVE METABOLIC PANEL - Abnormal; Notable for the following components:   Glucose, Bld 133 (*)    AST 59 (*)    ALT 58 (*)    All other components within normal limits   TROPONIN I  TSH    EKG  EKG Interpretation None       Radiology Dg Chest 2 View  Result Date: 04/19/2017 CLINICAL DATA:  Cough and shortness of breath EXAM: CHEST  2 VIEW COMPARISON:  December 31, 2014 FINDINGS: There are apparent nipple shadows bilaterally. There is mild bibasilar atelectasis. There is no edema or consolidation. Heart size and pulmonary vascularity are normal. No adenopathy. There is thoracolumbar levoscoliosis. There is degenerative change in the thoracic spine. IMPRESSION: Bibasilar atelectasis. No edema or consolidation. Nipple shadows noted bilaterally. Cardiac silhouette within normal limits. Electronically Signed   By: Bretta Bang III M.D.   On: 04/19/2017 13:16    Procedures Procedures (including critical care time)  Medications Ordered in ED  Medications  sodium chloride 0.9 % bolus 1,000 mL (0 mLs Intravenous Stopped 04/19/17 1442)     Initial Impression / Assessment and Plan / ED Course  I have reviewed the triage vital signs and the nursing notes.  Pertinent labs & imaging results that were available during my care of the patient were reviewed by me and considered in my medical decision making (see chart for details).     Labs unremarkable.  Patient with persistent mild tachycardia.  We will get a thyroid study and she will follow-up with her PCP  Final Clinical Impressions(s) / ED Diagnoses   Final diagnoses:  Tachycardia    ED Discharge Orders    None       Bethann BerkshireZammit, Chaim Gatley, MD 04/19/17 [phone]
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
## 2017-04-19 NOTE — OR Nursing (Signed)
Patient arrived to preop  for cataract removal,  Blood pressure elevated, 157/101,  Heart 140,  Complaints of shortness of breath.   Dr. Jayme CloudGonzalez in to see patient. Patient transferred to ER.  Dr. Nile RiggsShapiro aware and agrees to send patient to ER.  Accompanied to ER via  Wheelchair and friend Wilber OliphantCaleb. Report given to JJ.

## 2017-04-25 DIAGNOSIS — R000 Tachycardia, unspecified: Secondary | ICD-10-CM | POA: Diagnosis not present

## 2017-05-16 ENCOUNTER — Encounter (HOSPITAL_COMMUNITY)
Admission: RE | Admit: 2017-05-16 | Discharge: 2017-05-16 | Disposition: A | Discharge status: Home / Self Care | Payer: Medicare Other | Source: Ambulatory Visit | Attending: Ophthalmology | Admitting: Ophthalmology

## 2017-05-17 ENCOUNTER — Ambulatory Visit (HOSPITAL_COMMUNITY): Admission: RE | Admit: 2017-05-17 | Payer: Medicare Other | Source: Ambulatory Visit | Admitting: Ophthalmology

## 2017-05-17 ENCOUNTER — Encounter (HOSPITAL_COMMUNITY): Admission: RE | Payer: Self-pay | Source: Ambulatory Visit

## 2017-05-17 SURGERY — PHACOEMULSIFICATION, CATARACT, WITH IOL INSERTION
Anesthesia: Monitor Anesthesia Care | Laterality: Left

## 2017-05-17 NOTE — H&P (Signed)
The patient was re examined and there is no change in the patients condition since the original H and P. 

## 2017-06-02 ENCOUNTER — Other Ambulatory Visit: Payer: Self-pay

## 2017-06-02 ENCOUNTER — Encounter (HOSPITAL_COMMUNITY): Payer: Self-pay | Admitting: Emergency Medicine

## 2017-06-02 DIAGNOSIS — J449 Chronic obstructive pulmonary disease, unspecified: Secondary | ICD-10-CM | POA: Insufficient documentation

## 2017-06-02 DIAGNOSIS — F419 Anxiety disorder, unspecified: Secondary | ICD-10-CM | POA: Diagnosis not present

## 2017-06-02 DIAGNOSIS — Z79899 Other long term (current) drug therapy: Secondary | ICD-10-CM | POA: Diagnosis not present

## 2017-06-02 DIAGNOSIS — I10 Essential (primary) hypertension: Secondary | ICD-10-CM | POA: Insufficient documentation

## 2017-06-02 DIAGNOSIS — F17210 Nicotine dependence, cigarettes, uncomplicated: Secondary | ICD-10-CM | POA: Diagnosis not present

## 2017-06-02 DIAGNOSIS — R069 Unspecified abnormalities of breathing: Secondary | ICD-10-CM | POA: Diagnosis not present

## 2017-06-02 LAB — BASIC METABOLIC PANEL
Anion gap: 15 (ref 5–15)
BUN: 11 mg/dL (ref 6–20)
CO2: 20 mmol/L — ABNORMAL LOW (ref 22–32)
Calcium: 9.7 mg/dL (ref 8.9–10.3)
Chloride: 99 mmol/L — ABNORMAL LOW (ref 101–111)
Creatinine, Ser: 0.76 mg/dL (ref 0.44–1.00)
GFR calc Af Amer: >60 mL/min (ref >60)
GFR calc non Af Amer: >60 mL/min (ref >60)
Glucose, Bld: 135 mg/dL — ABNORMAL HIGH (ref 65–99)
Potassium: 3.5 mmol/L (ref 3.5–5.1)
Sodium: 134 mmol/L — ABNORMAL LOW (ref 135–145)

## 2017-06-02 LAB — CBC WITH DIFFERENTIAL/PLATELET
Basophils Absolute: 0 10*3/uL (ref 0.0–0.1)
Basophils Relative: 0 %
Eosinophils Absolute: 0.1 10*3/uL (ref 0.0–0.7)
Eosinophils Relative: 1 %
HCT: 44.2 % (ref 36.0–46.0)
Hemoglobin: 14.9 g/dL (ref 12.0–15.0)
Lymphocytes Relative: 23 %
Lymphs Abs: 2.3 10*3/uL (ref 0.7–4.0)
MCH: 32.5 pg (ref 26.0–34.0)
MCHC: 33.7 g/dL (ref 30.0–36.0)
MCV: 96.5 fL (ref 78.0–100.0)
Monocytes Absolute: 0.5 10*3/uL (ref 0.1–1.0)
Monocytes Relative: 5 %
Neutro Abs: 6.9 10*3/uL (ref 1.7–7.7)
Neutrophils Relative %: 71 %
Platelets: 218 10*3/uL (ref 150–400)
RBC: 4.58 MIL/uL (ref 3.87–5.11)
RDW: 13 % (ref 11.5–15.5)
WBC: 9.8 10*3/uL (ref 4.0–10.5)

## 2017-06-02 LAB — ETHANOL: Alcohol, Ethyl (B): <10 mg/dL (ref <10)

## 2017-06-02 NOTE — ED Triage Notes (Signed)
Pt states she has been out of meds x one month and c/o anxiety and high blood pressure.

## 2017-06-03 ENCOUNTER — Emergency Department (HOSPITAL_COMMUNITY)
Admission: EM | Admit: 2017-06-03 | Discharge: 2017-06-03 | Disposition: A | Discharge status: Home / Self Care | Payer: Medicare Other | Attending: Emergency Medicine | Admitting: Emergency Medicine

## 2017-06-03 DIAGNOSIS — F419 Anxiety disorder, unspecified: Secondary | ICD-10-CM | POA: Diagnosis not present

## 2017-06-03 MED ORDER — TRAZODONE HCL 50 MG PO TABS: 25.0000 mg | ORAL_TABLET | Freq: Every day | ORAL | 0 refills | Status: AC

## 2017-06-03 MED ORDER — LORAZEPAM 1 MG PO TABS
1.0000 mg | ORAL_TABLET | Freq: Once | ORAL | Status: AC
  Administered 2017-06-03: 1 mg via ORAL
  Filled 2017-06-03: qty 1

## 2017-06-03 MED ORDER — RISPERIDONE 2 MG PO TABS: 2.0000 mg | ORAL_TABLET | Freq: Every day | ORAL | 0 refills | Status: AC

## 2017-06-03 NOTE — Discharge Instructions (Signed)
Resume your medications as prescribed today.  Follow-up with your primary doctor for future refills.

## 2017-06-03 NOTE — ED Notes (Signed)
Pt ambulatory to the waiting room with steady gate. Pt had no questions regarding discharge instructions.

## 2017-06-03 NOTE — ED Notes (Signed)
Went in to discharge pt., pt stating "I am never coming back to this fucking hospital, fuck you sir."  Pt now crying and requesting a ride to her residence. Pt advised we do not give out rides to pts for a ride home. Pt told she is more than welcome to wait in the waiting room as long as she would like.

## 2017-06-03 NOTE — ED Provider Notes (Signed)
[facility] Provider Note   CSN: [id number] Arrival date & time: 06/02/17  2135     History   Chief Complaint Chief Complaint  Patient presents with  . Anxiety    HPI Jennifer Flores is a 52 y.o. female.  Patient is a 52 year old female with past medical history of bipolar disorder, anxiety, polysubstance abuse presenting today for evaluation of anxiety.  She states she has been off of her medications for the past month for reasons that are unclear to me.  She began experiencing anxiety and panic-like attacks over the past several days.  She denies any suicidal or homicidal ideation.   The history is provided by the patient.  Anxiety  This is a chronic problem. The current episode started 2 days ago. The problem occurs constantly. The problem has been rapidly worsening. Nothing aggravates the symptoms. Nothing relieves the symptoms. She has tried nothing for the symptoms.    Past Medical History:  Diagnosis Date  . Alcohol addiction (HCC)   . Anxiety   . Bipolar disease, chronic (HCC) 2000   Dr. Betti Cruzeddy /   . Bronchitis   . COPD (chronic obstructive pulmonary disease) (HCC)   . Depression   . History of kidney stones   . Hypertension   . Narcotic addiction (HCC)   . Palpitations   . Panic attack   . Scabies   . Spider bite 2008   Seen in the ED   . Suicide attempt [facility](HCC)     Patient Active Problem List   Diagnosis Date Noted  . 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 Generalized anxiety disorder 11/22/2011  . DERMATOMYCOSIS 05/24/2010  . UNSPECIFIED CYSTITIS 05/22/2010  . AMENORRHEA 05/22/2010  . DERMATITIS 05/22/2010  . DISORDER OF BONE AND CARTILAGE UNSPECIFIED 05/22/2010  . VAGINITIS 02/26/2010  . ABSCESS, VULVA 02/26/2010  . LEG PAIN 02/26/2010  . OTITIS EXTERNA 07/11/2009  . ACQUIRED KERATODERMA 05/07/2009  . RASH AND OTHER NONSPECIFIC SKIN ERUPTION 05/07/2009  . ACUTE CYSTITIS  04/01/2009  . PELVIC INFLAMMATORY DISEASE 04/01/2009  . BIPOLAR DISORDER UNSPECIFIED 02/18/2009  . NICOTINE ADDICTION 02/18/2009  . DEPRESSION 02/18/2009  . ACUTE MAXILLARY SINUSITIS 02/18/2009  . POLYMENORRHEA 02/18/2009  . FATIGUE 02/18/2009    Past Surgical History:  Procedure Laterality Date  . CESAREAN SECTION  2004  . extraction of teeth  2010    OB History    No data available       Home Medications    Prior to Admission medications   Medication Sig Start Date End Date Taking? Authorizing Provider  albuterol (PROVENTIL HFA;VENTOLIN HFA) 108 (90 BASE) MCG/ACT inhaler Inhale 2 puffs into the lungs every 6 (six) hours as needed for wheezing.    [provider]  Difluprednate 0.05 % EMUL Place 1 drop into both eyes 2 (two) times daily. 04/12/17   [provider]  ofloxacin (OCUFLOX) 0.3 % ophthalmic solution Place 1 drop into both eyes 2 (two) times daily. 04/12/17   [provider]  risperiDONE (RISPERDAL) 2 MG tablet Take 1 tablet (2 mg total) by mouth at bedtime. For mood stabilization 11/30/11 04/19/17  Nelly RoutKumar, Archana, MD  traZODone (DESYREL) 50 MG tablet Take 25 mg by mouth at bedtime.    [provider]    Family History Family History  Problem Relation Age of Onset  . Heart disease Mother   . Hypertension Mother   . Diabetes Father   . Anxiety disorder Father   .  Anxiety disorder Sister   . Depression Maternal Grandmother     Social History Social History   Tobacco Use  . Smoking status: Current Every Day Smoker    Packs/day: 1.00    Years: 30.00    Pack years: 30.00    Types: Cigarettes  . Smokeless tobacco: Never Used  Substance Use Topics  . Alcohol use: Yes    Comment: 2--- 40 oz a day.  last drink last night before 8  . Drug use: No    Comment: patient said she quit approx 2 years ago narcotic addition     Allergies   Penicillins   Review of Systems Review of Systems  All other systems reviewed and are  negative.    Physical Exam Updated Vital Signs BP (!) 137/98 (BP Location: Right Arm)   Pulse (!) 122   Temp 98.1 F (36.7 C) (Oral)   Resp (!) 22   LMP 11/15/2011   SpO2 100%   Physical Exam  Constitutional: She is oriented to person, place, and time. She appears well-developed and well-nourished. No distress.  Patient is anxious and tearful.  HENT:  Head: Normocephalic and atraumatic.  Neck: Normal range of motion. Neck supple.  Cardiovascular: Normal rate and regular rhythm. Exam reveals no gallop and no friction rub.  No murmur heard. Pulmonary/Chest: Effort normal and breath sounds normal. No respiratory distress. She has no wheezes.  Abdominal: Soft. Bowel sounds are normal. She exhibits no distension. There is no tenderness.  Musculoskeletal: Normal range of motion.  Neurological: She is alert and oriented to person, place, and time.  Skin: Skin is warm and dry. She is not diaphoretic.  Psychiatric: Her mood appears anxious. Her affect is blunt. Her speech is rapid and/or pressured. Cognition and memory are normal. She expresses no homicidal and no suicidal ideation. She expresses no suicidal plans and no homicidal plans.  Nursing note and vitals reviewed.    ED Treatments / Results  Labs (all labs ordered are listed, but only abnormal results are displayed) Labs Reviewed  BASIC METABOLIC PANEL - Abnormal; Notable for the following components:      Result Value   Sodium 134 (*)    Chloride 99 (*)    CO2 20 (*)    Glucose, Bld 135 (*)    All other components within normal limits  CBC WITH DIFFERENTIAL/PLATELET  ETHANOL  RAPID URINE DRUG SCREEN, HOSP PERFORMED    EKG  EKG Interpretation None       Radiology No results found.  Procedures Procedures (including critical care time)  Medications Ordered in ED Medications  LORazepam (ATIVAN) tablet 1 mg (not administered)     Initial Impression / Assessment and Plan / ED Course  I have reviewed the  triage vital signs and the nursing notes.  Pertinent labs & imaging results that were available during my care of the patient were reviewed by me and considered in my medical decision making (see chart for details).  Patient presents here complaining of anxiety and panic attacks after being off of her medications for the past month.  She is requesting Ativan.  She was given 1 mg of this here this evening.  Her laboratory studies are reassuring and I see no indication for further workup.  She is not suicidal or homicidal and does not appear to be having any auditory or visual hallucinations.  I do not feel she is in need of emergent inpatient psychiatric care.    She seems very angry  with me because I would not immediately arrange for her ride home to [place], then accused me of not caring about her.  I informed her I was happy to refill her medications, but she had to follow-up with her primary doctor for future refills.  Final Clinical Impressions(s) / ED Diagnoses   Final diagnoses:  None    ED Discharge Orders    None       Geoffery Lyons, MD 06/03/17 [phone]

## 2017-06-09 DIAGNOSIS — R7309 Other abnormal glucose: Secondary | ICD-10-CM | POA: Diagnosis not present

## 2017-06-09 DIAGNOSIS — Z0000 Encounter for general adult medical examination without abnormal findings: Secondary | ICD-10-CM | POA: Diagnosis not present

## 2017-06-09 DIAGNOSIS — E039 Hypothyroidism, unspecified: Secondary | ICD-10-CM | POA: Diagnosis not present

## 2017-06-09 DIAGNOSIS — Z23 Encounter for immunization: Secondary | ICD-10-CM | POA: Diagnosis not present

## 2017-06-09 DIAGNOSIS — I10 Essential (primary) hypertension: Secondary | ICD-10-CM | POA: Diagnosis not present

## 2017-06-09 DIAGNOSIS — Z1389 Encounter for screening for other disorder: Secondary | ICD-10-CM | POA: Diagnosis not present

## 2017-06-09 DIAGNOSIS — F17200 Nicotine dependence, unspecified, uncomplicated: Secondary | ICD-10-CM | POA: Diagnosis not present

## 2017-06-09 DIAGNOSIS — F319 Bipolar disorder, unspecified: Secondary | ICD-10-CM | POA: Diagnosis not present

## 2017-06-09 DIAGNOSIS — Z1331 Encounter for screening for depression: Secondary | ICD-10-CM | POA: Diagnosis not present

## 2017-06-09 DIAGNOSIS — E7849 Other hyperlipidemia: Secondary | ICD-10-CM | POA: Diagnosis not present

## 2017-06-30 DIAGNOSIS — K219 Gastro-esophageal reflux disease without esophagitis: Secondary | ICD-10-CM | POA: Diagnosis not present

## 2017-06-30 DIAGNOSIS — F319 Bipolar disorder, unspecified: Secondary | ICD-10-CM | POA: Diagnosis not present

## 2017-06-30 DIAGNOSIS — J410 Simple chronic bronchitis: Secondary | ICD-10-CM | POA: Diagnosis not present

## 2017-06-30 DIAGNOSIS — F17200 Nicotine dependence, unspecified, uncomplicated: Secondary | ICD-10-CM | POA: Diagnosis not present

## 2017-08-11 DIAGNOSIS — F609 Personality disorder, unspecified: Secondary | ICD-10-CM | POA: Diagnosis not present

## 2017-08-22 DIAGNOSIS — M542 Cervicalgia: Secondary | ICD-10-CM | POA: Diagnosis not present

## 2017-08-22 DIAGNOSIS — J40 Bronchitis, not specified as acute or chronic: Secondary | ICD-10-CM | POA: Diagnosis not present

## 2017-08-22 DIAGNOSIS — I10 Essential (primary) hypertension: Secondary | ICD-10-CM | POA: Diagnosis not present

## 2017-08-22 DIAGNOSIS — F319 Bipolar disorder, unspecified: Secondary | ICD-10-CM | POA: Diagnosis not present

## 2018-01-24 DIAGNOSIS — F609 Personality disorder, unspecified: Secondary | ICD-10-CM | POA: Diagnosis not present

## 2018-02-16 DIAGNOSIS — H2523 Age-related cataract, morgagnian type, bilateral: Secondary | ICD-10-CM | POA: Diagnosis not present

## 2018-03-02 NOTE — Patient Instructions (Signed)
Your procedure is scheduled on: 03/17/2018  Report to [facility] at  745   AM.  Call this number if you have problems the morning of surgery: [phone]   Do not eat food or drink liquids :After Midnight.      Take these medicines the morning of surgery with A SIP OF WATER: None. Use your inhaler before you come.   Do not wear jewelry, make-up or nail polish.  Do not wear lotions, powders, or perfumes. You may wear deodorant.  Do not shave 48 hours prior to surgery.  Do not bring valuables to the hospital.  Contacts, dentures or bridgework may not be worn into surgery.  Leave suitcase in the car. After surgery it may be brought to your room.  For patients admitted to the hospital, checkout time is 11:00 AM the day of discharge.   Patients discharged the day of surgery will not be allowed to drive home.  :     Please read over the following fact sheets that you were given: Coughing and Deep Breathing, Surgical Site Infection Prevention, Anesthesia Post-op Instructions and Care and Recovery After Surgery    Cataract A cataract is a clouding of the lens of the eye. When a lens becomes cloudy, vision is reduced based on the degree and nature of the clouding. Many cataracts reduce vision to some degree. Some cataracts make people more near-sighted as they develop. Other cataracts increase glare. Cataracts that are ignored and become worse can sometimes look white. The white color can be seen through the pupil. CAUSES   Aging. However, cataracts may occur at any age, even in newborns.   Certain drugs.   Trauma to the eye.   Certain diseases such as diabetes.   Specific eye diseases such as chronic inflammation inside the eye or a sudden attack of a rare form of glaucoma.   Inherited or acquired medical problems.  SYMPTOMS   Gradual, progressive drop in vision in the affected eye.   Severe, rapid visual loss. This most often happens when trauma is the cause.  DIAGNOSIS  To detect a  cataract, an eye doctor examines the lens. Cataracts are best diagnosed with an exam of the eyes with the pupils enlarged (dilated) by drops.  TREATMENT  For an early cataract, vision may improve by using different eyeglasses or stronger lighting. If that does not help your vision, surgery is the only effective treatment. A cataract needs to be surgically removed when vision loss interferes with your everyday activities, such as driving, reading, or watching TV. A cataract may also have to be removed if it prevents examination or treatment of another eye problem. Surgery removes the cloudy lens and usually replaces it with a substitute lens (intraocular lens, IOL).  At a time when both you and your doctor agree, the cataract will be surgically removed. If you have cataracts in both eyes, only one is usually removed at a time. This allows the operated eye to heal and be out of danger from any possible problems after surgery (such as infection or poor wound healing). In rare cases, a cataract may be doing damage to your eye. In these cases, your caregiver may advise surgical removal right away. The vast majority of people who have cataract surgery have better vision afterward. HOME CARE INSTRUCTIONS  If you are not planning surgery, you may be asked to do the following:  Use different eyeglasses.   Use stronger or brighter lighting.   Ask your eye  doctor about reducing your medicine dose or changing medicines if it is thought that a medicine caused your cataract. Changing medicines does not make the cataract go away on its own.   Become familiar with your surroundings. Poor vision can lead to injury. Avoid bumping into things on the affected side. You are at a higher risk for tripping or falling.   Exercise extreme care when driving or operating machinery.   Wear sunglasses if you are sensitive to bright light or experiencing problems with glare.  SEEK IMMEDIATE MEDICAL CARE IF:   You have a  worsening or sudden vision loss.   You notice redness, swelling, or increasing pain in the eye.   You have a fever.  Document Released: 04/26/2005 Document Revised: 04/15/2011 Document Reviewed: 12/18/2010 [facility] Patient Information 2012 [place].PATIENT INSTRUCTIONS POST-ANESTHESIA  IMMEDIATELY FOLLOWING SURGERY:  Do not drive or operate machinery for the first twenty four hours after surgery.  Do not make any important decisions for twenty four hours after surgery or while taking narcotic pain medications or sedatives.  If you develop intractable nausea and vomiting or a severe headache please notify your doctor immediately.  FOLLOW-UP:  Please make an appointment with your surgeon as instructed. You do not need to follow up with anesthesia unless specifically instructed to do so.  WOUND CARE INSTRUCTIONS (if applicable):  Keep a dry clean dressing on the anesthesia/puncture wound site if there is drainage.  Once the wound has quit draining you may leave it open to air.  Generally you should leave the bandage intact for twenty four hours unless there is drainage.  If the epidural site drains for more than 36-48 hours please call the anesthesia department.  QUESTIONS?:  Please feel free to call your physician or the hospital operator if you have any questions, and they will be happy to assist you.

## 2018-03-10 ENCOUNTER — Encounter (HOSPITAL_COMMUNITY)
Admission: RE | Admit: 2018-03-10 | Discharge: 2018-03-10 | Disposition: A | Discharge status: Home / Self Care | Payer: Medicare Other | Source: Ambulatory Visit | Attending: Ophthalmology | Admitting: Ophthalmology

## 2018-03-10 ENCOUNTER — Other Ambulatory Visit: Payer: Self-pay

## 2018-03-10 ENCOUNTER — Encounter (HOSPITAL_COMMUNITY): Payer: Self-pay

## 2018-03-10 DIAGNOSIS — Z01818 Encounter for other preprocedural examination: Secondary | ICD-10-CM | POA: Insufficient documentation

## 2018-03-10 DIAGNOSIS — R9431 Abnormal electrocardiogram [ECG] [EKG]: Secondary | ICD-10-CM | POA: Diagnosis not present

## 2018-03-10 DIAGNOSIS — H2522 Age-related cataract, morgagnian type, left eye: Secondary | ICD-10-CM | POA: Diagnosis not present

## 2018-03-10 HISTORY — DX: Pure hypercholesterolemia, unspecified: E78.00

## 2018-03-10 LAB — CBC WITH DIFFERENTIAL/PLATELET
Abs Immature Granulocytes: 0.04 10*3/uL (ref 0.00–0.07)
Basophils Absolute: 0.1 10*3/uL (ref 0.0–0.1)
Basophils Relative: 1 %
Eosinophils Absolute: 0.1 10*3/uL (ref 0.0–0.5)
Eosinophils Relative: 1 %
HCT: 42.8 % (ref 36.0–46.0)
Hemoglobin: 14.2 g/dL (ref 12.0–15.0)
Immature Granulocytes: 1 %
Lymphocytes Relative: 29 %
Lymphs Abs: 2.3 10*3/uL (ref 0.7–4.0)
MCH: 33.4 pg (ref 26.0–34.0)
MCHC: 33.2 g/dL (ref 30.0–36.0)
MCV: 100.7 fL — ABNORMAL HIGH (ref 80.0–100.0)
Monocytes Absolute: 0.6 10*3/uL (ref 0.1–1.0)
Monocytes Relative: 8 %
Neutro Abs: 4.6 10*3/uL (ref 1.7–7.7)
Neutrophils Relative %: 60 %
Platelets: 188 10*3/uL (ref 150–400)
RBC: 4.25 MIL/uL (ref 3.87–5.11)
RDW: 13.2 % (ref 11.5–15.5)
WBC: 7.7 10*3/uL (ref 4.0–10.5)
nRBC: 0 % (ref 0.0–0.2)

## 2018-03-10 LAB — BASIC METABOLIC PANEL
Anion gap: 11 (ref 5–15)
BUN: 10 mg/dL (ref 6–20)
CO2: 21 mmol/L — ABNORMAL LOW (ref 22–32)
Calcium: 9 mg/dL (ref 8.9–10.3)
Chloride: 104 mmol/L (ref 98–111)
Creatinine, Ser: 0.74 mg/dL (ref 0.44–1.00)
GFR calc Af Amer: >60 mL/min (ref >60)
GFR calc non Af Amer: >60 mL/min (ref >60)
Glucose, Bld: 148 mg/dL — ABNORMAL HIGH (ref 70–99)
Potassium: 3.8 mmol/L (ref 3.5–5.1)
Sodium: 136 mmol/L (ref 135–145)

## 2018-03-17 ENCOUNTER — Ambulatory Visit (HOSPITAL_COMMUNITY): Payer: Medicare Other | Admitting: Anesthesiology

## 2018-03-17 ENCOUNTER — Encounter (HOSPITAL_COMMUNITY): Payer: Self-pay | Admitting: *Deleted

## 2018-03-17 ENCOUNTER — Encounter (HOSPITAL_COMMUNITY)
Admission: RE | Disposition: A | Discharge status: Home / Self Care | Payer: Self-pay | Source: Ambulatory Visit | Attending: Ophthalmology

## 2018-03-17 ENCOUNTER — Ambulatory Visit (HOSPITAL_COMMUNITY)
Admission: RE | Admit: 2018-03-17 | Discharge: 2018-03-17 | Disposition: A | Discharge status: Home / Self Care | Payer: Medicare Other | Source: Ambulatory Visit | Attending: Ophthalmology | Admitting: Ophthalmology

## 2018-03-17 DIAGNOSIS — J449 Chronic obstructive pulmonary disease, unspecified: Secondary | ICD-10-CM | POA: Diagnosis not present

## 2018-03-17 DIAGNOSIS — H2523 Age-related cataract, morgagnian type, bilateral: Secondary | ICD-10-CM | POA: Insufficient documentation

## 2018-03-17 DIAGNOSIS — F411 Generalized anxiety disorder: Secondary | ICD-10-CM | POA: Diagnosis not present

## 2018-03-17 DIAGNOSIS — F319 Bipolar disorder, unspecified: Secondary | ICD-10-CM | POA: Diagnosis not present

## 2018-03-17 DIAGNOSIS — Z79899 Other long term (current) drug therapy: Secondary | ICD-10-CM | POA: Diagnosis not present

## 2018-03-17 DIAGNOSIS — F17200 Nicotine dependence, unspecified, uncomplicated: Secondary | ICD-10-CM | POA: Diagnosis not present

## 2018-03-17 DIAGNOSIS — I10 Essential (primary) hypertension: Secondary | ICD-10-CM | POA: Insufficient documentation

## 2018-03-17 DIAGNOSIS — H2522 Age-related cataract, morgagnian type, left eye: Secondary | ICD-10-CM | POA: Diagnosis not present

## 2018-03-17 HISTORY — PX: CATARACT EXTRACTION W/PHACO: SHX586

## 2018-03-17 SURGERY — PHACOEMULSIFICATION, CATARACT, WITH IOL INSERTION
Anesthesia: Monitor Anesthesia Care | Site: Eye | Laterality: Left

## 2018-03-17 MED ORDER — BSS IO SOLN
INTRAOCULAR | Status: DC | PRN
  Administered 2018-03-17: 15 mL

## 2018-03-17 MED ORDER — PROPOFOL 10 MG/ML IV BOLUS
INTRAVENOUS | Status: AC
  Filled 2018-03-17: qty 20

## 2018-03-17 MED ORDER — NEOMYCIN-POLYMYXIN-DEXAMETH 3.5-10000-0.1 OP SUSP
OPHTHALMIC | Status: DC | PRN
  Administered 2018-03-17: 2 [drp] via OPHTHALMIC

## 2018-03-17 MED ORDER — LIDOCAINE HCL 3.5 % OP GEL
1.0000 "application " | Freq: Once | OPHTHALMIC | Status: AC
  Administered 2018-03-17: 1 via OPHTHALMIC

## 2018-03-17 MED ORDER — PHENYLEPHRINE HCL 2.5 % OP SOLN
1.0000 [drp] | OPHTHALMIC | Status: AC
  Administered 2018-03-17 (×3): 1 [drp] via OPHTHALMIC

## 2018-03-17 MED ORDER — TRYPAN BLUE 0.06 % OP SOLN
OPHTHALMIC | Status: DC | PRN
  Administered 2018-03-17: 0.5 mL via INTRAOCULAR

## 2018-03-17 MED ORDER — POVIDONE-IODINE 5 % OP SOLN
OPHTHALMIC | Status: DC | PRN
  Administered 2018-03-17: 1 via OPHTHALMIC

## 2018-03-17 MED ORDER — LIDOCAINE HCL (PF) 2 % IJ SOLN
INTRAMUSCULAR | Status: AC
  Filled 2018-03-17: qty 10

## 2018-03-17 MED ORDER — MIDAZOLAM HCL 5 MG/5ML IJ SOLN
INTRAMUSCULAR | Status: DC | PRN
  Administered 2018-03-17: 2 mg via INTRAVENOUS

## 2018-03-17 MED ORDER — TRYPAN BLUE 0.06 % OP SOLN
OPHTHALMIC | Status: AC
  Filled 2018-03-17: qty 0.5

## 2018-03-17 MED ORDER — LACTATED RINGERS IV SOLN
INTRAVENOUS | Status: DC | PRN
  Administered 2018-03-17: 09:00:00 via INTRAVENOUS

## 2018-03-17 MED ORDER — BUPIVACAINE HCL (PF) 0.5 % IJ SOLN
INTRAMUSCULAR | Status: DC | PRN
  Administered 2018-03-17: 2.5 mL

## 2018-03-17 MED ORDER — MIDAZOLAM HCL 2 MG/2ML IJ SOLN
INTRAMUSCULAR | Status: AC
  Filled 2018-03-17: qty 2

## 2018-03-17 MED ORDER — LIDOCAINE HCL (PF) 2 % IJ SOLN
INTRAMUSCULAR | Status: DC | PRN
  Administered 2018-03-17: 2.5 mL

## 2018-03-17 MED ORDER — PROVISC 10 MG/ML IO SOLN
INTRAOCULAR | Status: DC | PRN
  Administered 2018-03-17: 0.85 mL via INTRAOCULAR

## 2018-03-17 MED ORDER — MIDAZOLAM HCL 2 MG/2ML IJ SOLN
0.5000 mg | INTRAMUSCULAR | Status: DC | PRN
  Administered 2018-03-17: 2 mg via INTRAVENOUS

## 2018-03-17 MED ORDER — LACTATED RINGERS IV SOLN
INTRAVENOUS | Status: DC
  Administered 2018-03-17: 1000 mL via INTRAVENOUS

## 2018-03-17 MED ORDER — EPINEPHRINE PF 1 MG/ML IJ SOLN
INTRAOCULAR | Status: DC | PRN
  Administered 2018-03-17: 500 mL

## 2018-03-17 MED ORDER — SODIUM HYALURONATE 23 MG/ML IO SOLN
INTRAOCULAR | Status: DC | PRN
  Administered 2018-03-17: 0.6 mL via INTRAOCULAR

## 2018-03-17 MED ORDER — LIDOCAINE HCL (PF) 1 % IJ SOLN
INTRAOCULAR | Status: DC | PRN
  Administered 2018-03-17: 1 mL via OPHTHALMIC

## 2018-03-17 MED ORDER — CYCLOPENTOLATE-PHENYLEPHRINE 0.2-1 % OP SOLN
1.0000 [drp] | OPHTHALMIC | Status: AC
  Administered 2018-03-17 (×3): 1 [drp] via OPHTHALMIC

## 2018-03-17 MED ORDER — BUPIVACAINE HCL (PF) 0.5 % IJ SOLN
INTRAMUSCULAR | Status: AC
  Filled 2018-03-17: qty 30

## 2018-03-17 MED ORDER — TETRACAINE HCL 0.5 % OP SOLN
1.0000 [drp] | OPHTHALMIC | Status: AC
  Administered 2018-03-17 (×3): 1 [drp] via OPHTHALMIC

## 2018-03-17 SURGICAL SUPPLY — 16 items
CLOTH BEACON ORANGE TIMEOUT ST (SAFETY) ×2 IMPLANT
EYE SHIELD UNIVERSAL CLEAR (GAUZE/BANDAGES/DRESSINGS) ×2 IMPLANT
GLOVE BIOGEL PI IND STRL 6.5 (GLOVE) IMPLANT
GLOVE BIOGEL PI INDICATOR 6.5 (GLOVE) ×4
LENS ALC ACRYL/TECN (Ophthalmic Related) ×2 IMPLANT
NDL HYPO 18GX1.5 BLUNT FILL (NEEDLE) IMPLANT
NDL HYPO 25X1 1.5 SAFETY (NEEDLE) IMPLANT
NEEDLE HYPO 18GX1.5 BLUNT FILL (NEEDLE) ×6 IMPLANT
NEEDLE HYPO 25X1 1.5 SAFETY (NEEDLE) ×3 IMPLANT
PAD ARMBOARD 7.5X6 YLW CONV (MISCELLANEOUS) ×2 IMPLANT
SYR 5ML LL (SYRINGE) ×2 IMPLANT
SYR TB 1ML LL NO SAFETY (SYRINGE) ×2 IMPLANT
TAPE SURG TRANSPORE 1 IN (GAUZE/BANDAGES/DRESSINGS) IMPLANT
TAPE SURGICAL TRANSPORE 1 IN (GAUZE/BANDAGES/DRESSINGS) ×2
VISCOELASTIC ADDITIONAL (OPHTHALMIC RELATED) ×2 IMPLANT
WATER STERILE IRR 250ML POUR (IV SOLUTION) ×2 IMPLANT

## 2018-03-17 NOTE — Transfer of Care (Signed)
Immediate Anesthesia Transfer of Care Note  Patient: Jennifer Flores  Procedure(s) Performed: CATARACT EXTRACTION PHACO AND INTRAOCULAR LENS PLACEMENT (IOC) (Left Eye)  Patient Location: Short Stay  Anesthesia Type:MAC  Level of Consciousness: awake and patient cooperative  Airway & Oxygen Therapy: Patient Spontanous Breathing  Post-op Assessment: Report given to RN, Post -op Vital signs reviewed and stable and Patient moving all extremities  Post vital signs: Reviewed and stable  Last Vitals:  Vitals Value Taken Time  BP    Temp    Pulse    Resp    SpO2      Last Pain:  Vitals:   03/17/18 0834  TempSrc: Oral  PainSc: 0-No pain      Patients Stated Pain Goal: 8 (73/42/87 [id number])  Complications: No apparent anesthesia complications

## 2018-03-17 NOTE — Anesthesia Postprocedure Evaluation (Signed)
Anesthesia Post Note  Patient: Jennifer Flores  Procedure(s) Performed: CATARACT EXTRACTION PHACO AND INTRAOCULAR LENS PLACEMENT (IOC) (Left Eye)  Patient location during evaluation: Short Stay Anesthesia Type: MAC Level of consciousness: awake and patient cooperative Pain management: pain level controlled Vital Signs Assessment: post-procedure vital signs reviewed and stable Respiratory status: spontaneous breathing, nonlabored ventilation and respiratory function stable Cardiovascular status: blood pressure returned to baseline Postop Assessment: no apparent nausea or vomiting Anesthetic complications: no     Last Vitals:  Vitals:   03/17/18 0900 03/17/18 0905  BP: (!) 139/97   Pulse:    Resp: (!) 23 20  Temp:    SpO2: 96% 100%    Last Pain:  Vitals:   03/17/18 0834  TempSrc: Oral  PainSc: 0-No pain                 [name]

## 2018-03-17 NOTE — Discharge Instructions (Addendum)
Please discharge patient when stable, will follow up today with Dr. Wrzosek at the [facility] office immediately following discharge.  Leave shield in place until visit.  All paperwork with discharge instructions will be given at the office. ° °

## 2018-03-17 NOTE — Op Note (Signed)
Date of procedure: 03/17/18  Pre-operative diagnosis: Mature, Visually significant cataract, Left Eye (H25.22)  Post-operative diagnosis: Mature Visually significant cataract, Left Eye  Procedure: Complex Removal of cataract via phacoemulsification and insertion of intra-ocular lens AMO PCB00 +20.0D into the capsular bag of the Left Eye  Attending surgeon: Gerda Diss. Micharl Helmes, MD, MA  Anesthesia: MAC, Topical Akten  Complications: None  Estimated Blood Loss: <45m (minimal)  Specimens: None  Implants: As above  Indications:  Visually significant cataract, Left Eye  Procedure:  The patient was seen and identified in the pre-operative area. The operative eye was identified and dilated.  The operative eye was marked.  Topical anesthesia was administered to the operative eye.     The patient was then to the operative suite and placed in the supine position.  A timeout was performed confirming the patient, procedure to be performed, and all other relevant information.   The patient's face was prepped and draped in the usual fashion for intra-ocular surgery.  A lid speculum was placed into the operative eye and the surgical microscope moved into place and focused.  A lack of red reflex due to a mature cataract was confirmed.  An inferotemporal paracentesis was created using a 20 gauge paracentesis blade.  Vision blue was injected into the anterior chamber.  Shugarcaine was injected into the anterior chamber.  Viscoelastic was injected into the anterior chamber.  A temporal clear-corneal main wound incision was created using a 2.441mmicrokeratome.  A continuous curvilinear capsulorrhexis was initiated using an irrigating cystitome and completed using capsulorrhexis forceps.  Hydrodissection and hydrodeliniation were performed.  Viscoelastic was injected into the anterior chamber.  A phacoemulsification handpiece and a chopper as a second instrument were used to remove the nucleus and epinucleus. The  irrigation/aspiration handpiece was used to remove any remaining cortical material.   The capsular bag was reinflated with viscoelastic, checked, and found to be intact. The intraocular lens was inserted into the capsular bag and dialed into place using a kuglen hook.  The irrigation/aspiration handpiece was used to remove any remaining viscoelastic.  The clear corneal wound and paracentesis wounds were then hydrated and checked with Weck-Cels to be watertight.  The lid-speculum and drape was removed, and the patient's face was cleaned with a wet and dry 4x4.  Maxitrol was instilled in the eye before a clear shield was taped over the eye. The patient was taken to the post-operative care unit in good condition, having tolerated the procedure well.  Post-Op Instructions: [facility] and will receive all other orders and instructions.

## 2018-03-17 NOTE — H&P (Signed)
The H and P was reviewed and updated. The patient was examined.  No changes were found after exam.  The surgical eye was marked.  

## 2018-03-17 NOTE — Anesthesia Preprocedure Evaluation (Addendum)
Anesthesia Evaluation  Patient identified by MRN, date of birth, ID band Patient awake    Reviewed: Allergy & Precautions, H&P , NPO status , Patient's Chart, lab work & pertinent test results, reviewed documented beta blocker date and time   Airway Mallampati: II  TM Distance: >3 FB Neck ROM: full    Dental no notable dental hx.    Pulmonary neg pulmonary ROS, COPD, Current Smoker,    Pulmonary exam normal breath sounds clear to auscultation       Cardiovascular Exercise Tolerance: Good hypertension, negative cardio ROS   Rhythm:regular Rate:Normal     Neuro/Psych PSYCHIATRIC DISORDERS Anxiety Depression Bipolar Disorder negative neurological ROS  negative psych ROS   GI/Hepatic negative GI ROS, Neg liver ROS,   Endo/Other  negative endocrine ROS  Renal/GU negative Renal ROS  negative genitourinary   Musculoskeletal   Abdominal   Peds  Hematology negative hematology ROS (+)   Anesthesia Other Findings Major depressive disorder, recurrent severe without psychotic features (HCC) Generalized anxiety disorder   Narcotic addiction   Suicide attempt   Reproductive/Obstetrics negative OB ROS                             Anesthesia Physical Anesthesia Plan  ASA: III  Anesthesia Plan: MAC   Post-op Pain Management:    Induction:   PONV Risk Score and Plan:   Airway Management Planned:   Additional Equipment:   Intra-op Plan:   Post-operative Plan:   Informed Consent: I have reviewed the patients History and Physical, chart, labs and discussed the procedure including the risks, benefits and alternatives for the proposed anesthesia with the patient or authorized representative who has indicated his/her understanding and acceptance.   Dental Advisory Given  Plan Discussed with: CRNA  Anesthesia Plan Comments: (Very anxious; Versed in pre-op for anxiolysis)        Anesthesia Quick Evaluation

## 2018-03-20 ENCOUNTER — Encounter (HOSPITAL_COMMUNITY): Payer: Self-pay | Admitting: Ophthalmology

## 2018-03-20 ENCOUNTER — Other Ambulatory Visit (HOSPITAL_COMMUNITY): Payer: Self-pay

## 2018-04-03 ENCOUNTER — Encounter (HOSPITAL_COMMUNITY): Payer: Self-pay

## 2018-04-04 ENCOUNTER — Encounter (HOSPITAL_COMMUNITY)
Admission: RE | Admit: 2018-04-04 | Discharge: 2018-04-04 | Disposition: A | Discharge status: Home / Self Care | Payer: Medicare Other | Source: Ambulatory Visit | Attending: Ophthalmology | Admitting: Ophthalmology

## 2018-04-05 DIAGNOSIS — H2521 Age-related cataract, morgagnian type, right eye: Secondary | ICD-10-CM | POA: Diagnosis not present

## 2018-04-14 ENCOUNTER — Ambulatory Visit (HOSPITAL_COMMUNITY)
Admission: RE | Admit: 2018-04-14 | Discharge: 2018-04-14 | Disposition: A | Discharge status: Home / Self Care | Payer: Medicare Other | Source: Ambulatory Visit | Attending: Ophthalmology | Admitting: Ophthalmology

## 2018-04-14 ENCOUNTER — Ambulatory Visit (HOSPITAL_COMMUNITY): Payer: Medicare Other | Admitting: Anesthesiology

## 2018-04-14 ENCOUNTER — Encounter (HOSPITAL_COMMUNITY)
Admission: RE | Disposition: A | Discharge status: Home / Self Care | Payer: Self-pay | Source: Ambulatory Visit | Attending: Ophthalmology

## 2018-04-14 DIAGNOSIS — I10 Essential (primary) hypertension: Secondary | ICD-10-CM | POA: Insufficient documentation

## 2018-04-14 DIAGNOSIS — H2511 Age-related nuclear cataract, right eye: Secondary | ICD-10-CM | POA: Diagnosis not present

## 2018-04-14 DIAGNOSIS — Z79899 Other long term (current) drug therapy: Secondary | ICD-10-CM | POA: Diagnosis not present

## 2018-04-14 DIAGNOSIS — Z9842 Cataract extraction status, left eye: Secondary | ICD-10-CM | POA: Diagnosis not present

## 2018-04-14 DIAGNOSIS — J449 Chronic obstructive pulmonary disease, unspecified: Secondary | ICD-10-CM | POA: Insufficient documentation

## 2018-04-14 DIAGNOSIS — H25811 Combined forms of age-related cataract, right eye: Secondary | ICD-10-CM | POA: Insufficient documentation

## 2018-04-14 DIAGNOSIS — F17200 Nicotine dependence, unspecified, uncomplicated: Secondary | ICD-10-CM | POA: Diagnosis not present

## 2018-04-14 DIAGNOSIS — Z961 Presence of intraocular lens: Secondary | ICD-10-CM | POA: Insufficient documentation

## 2018-04-14 DIAGNOSIS — H5202 Hypermetropia, left eye: Secondary | ICD-10-CM | POA: Insufficient documentation

## 2018-04-14 DIAGNOSIS — F419 Anxiety disorder, unspecified: Secondary | ICD-10-CM | POA: Diagnosis not present

## 2018-04-14 DIAGNOSIS — F319 Bipolar disorder, unspecified: Secondary | ICD-10-CM | POA: Diagnosis not present

## 2018-04-14 DIAGNOSIS — H2521 Age-related cataract, morgagnian type, right eye: Secondary | ICD-10-CM | POA: Diagnosis not present

## 2018-04-14 HISTORY — PX: CATARACT EXTRACTION W/PHACO: SHX586

## 2018-04-14 LAB — GLUCOSE, CAPILLARY: Glucose-Capillary: 117 mg/dL — ABNORMAL HIGH (ref 70–99)

## 2018-04-14 SURGERY — PHACOEMULSIFICATION, CATARACT, WITH IOL INSERTION
Anesthesia: Monitor Anesthesia Care | Site: Eye | Laterality: Right

## 2018-04-14 MED ORDER — LIDOCAINE HCL (PF) 2 % IJ SOLN
INTRAMUSCULAR | Status: DC | PRN
  Administered 2018-04-14: 2.5 mL

## 2018-04-14 MED ORDER — POVIDONE-IODINE 5 % OP SOLN
OPHTHALMIC | Status: DC | PRN
  Administered 2018-04-14: 1 via OPHTHALMIC

## 2018-04-14 MED ORDER — MIDAZOLAM HCL 2 MG/2ML IJ SOLN
INTRAMUSCULAR | Status: AC
  Filled 2018-04-14: qty 2

## 2018-04-14 MED ORDER — BUPIVACAINE HCL (PF) 0.5 % IJ SOLN
INTRAMUSCULAR | Status: DC | PRN
  Administered 2018-04-14: 2.5 mL

## 2018-04-14 MED ORDER — BUPIVACAINE HCL (PF) 0.5 % IJ SOLN
INTRAMUSCULAR | Status: AC
  Filled 2018-04-14: qty 30

## 2018-04-14 MED ORDER — CYCLOPENTOLATE-PHENYLEPHRINE 0.2-1 % OP SOLN
1.0000 [drp] | OPHTHALMIC | Status: AC
  Administered 2018-04-14 (×3): 1 [drp] via OPHTHALMIC

## 2018-04-14 MED ORDER — TRYPAN BLUE 0.06 % OP SOLN
OPHTHALMIC | Status: AC
  Filled 2018-04-14: qty 0.5

## 2018-04-14 MED ORDER — EPINEPHRINE PF 1 MG/ML IJ SOLN
INTRAOCULAR | Status: DC | PRN
  Administered 2018-04-14: 500 mL

## 2018-04-14 MED ORDER — SODIUM HYALURONATE 23 MG/ML IO SOLN
INTRAOCULAR | Status: DC | PRN
  Administered 2018-04-14: 0.6 mL via INTRAOCULAR

## 2018-04-14 MED ORDER — LACTATED RINGERS IV SOLN
INTRAVENOUS | Status: DC
  Administered 2018-04-14: 08:00:00 via INTRAVENOUS

## 2018-04-14 MED ORDER — MIDAZOLAM HCL 5 MG/5ML IJ SOLN
INTRAMUSCULAR | Status: DC | PRN
  Administered 2018-04-14 (×2): 2 mg via INTRAVENOUS

## 2018-04-14 MED ORDER — LIDOCAINE HCL (PF) 1 % IJ SOLN
INTRAOCULAR | Status: DC | PRN
  Administered 2018-04-14: 1 mL via OPHTHALMIC

## 2018-04-14 MED ORDER — PROPOFOL 10 MG/ML IV BOLUS
INTRAVENOUS | Status: DC | PRN
  Administered 2018-04-14: 30 mg via INTRAVENOUS

## 2018-04-14 MED ORDER — LIDOCAINE HCL 3.5 % OP GEL
1.0000 "application " | Freq: Once | OPHTHALMIC | Status: AC
  Administered 2018-04-14: 1 via OPHTHALMIC

## 2018-04-14 MED ORDER — TETRACAINE HCL 0.5 % OP SOLN
1.0000 [drp] | OPHTHALMIC | Status: AC
  Administered 2018-04-14 (×3): 1 [drp] via OPHTHALMIC

## 2018-04-14 MED ORDER — BSS IO SOLN
INTRAOCULAR | Status: DC | PRN
  Administered 2018-04-14: 15 mL

## 2018-04-14 MED ORDER — TRYPAN BLUE 0.06 % OP SOLN
OPHTHALMIC | Status: DC | PRN
  Administered 2018-04-14: 0.5 mL via INTRAOCULAR

## 2018-04-14 MED ORDER — LIDOCAINE HCL (PF) 2 % IJ SOLN
INTRAMUSCULAR | Status: AC
  Filled 2018-04-14: qty 10

## 2018-04-14 MED ORDER — PHENYLEPHRINE HCL 2.5 % OP SOLN
1.0000 [drp] | OPHTHALMIC | Status: AC
  Administered 2018-04-14 (×3): 1 [drp] via OPHTHALMIC

## 2018-04-14 MED ORDER — NEOMYCIN-POLYMYXIN-DEXAMETH 3.5-10000-0.1 OP SUSP
OPHTHALMIC | Status: DC | PRN
  Administered 2018-04-14: 2 [drp] via OPHTHALMIC

## 2018-04-14 MED ORDER — EPINEPHRINE PF 1 MG/ML IJ SOLN
INTRAMUSCULAR | Status: AC
  Filled 2018-04-14: qty 2

## 2018-04-14 MED ORDER — PROVISC 10 MG/ML IO SOLN
INTRAOCULAR | Status: DC | PRN
  Administered 2018-04-14: 0.85 mL via INTRAOCULAR

## 2018-04-14 SURGICAL SUPPLY — 18 items
CLOTH BEACON ORANGE TIMEOUT ST (SAFETY) ×2 IMPLANT
DEVICE MILOOP (MISCELLANEOUS) IMPLANT
EYE SHIELD UNIVERSAL CLEAR (GAUZE/BANDAGES/DRESSINGS) ×2 IMPLANT
GLOVE BIOGEL PI IND STRL 7.0 (GLOVE) IMPLANT
GLOVE BIOGEL PI INDICATOR 7.0 (GLOVE) ×4
LENS ALC ACRYL/TECN (Ophthalmic Related) ×2 IMPLANT
MILOOP DEVICE (MISCELLANEOUS) ×3
NDL HYPO 18GX1.5 BLUNT FILL (NEEDLE) IMPLANT
NDL HYPO 25X1 1.5 SAFETY (NEEDLE) IMPLANT
NEEDLE HYPO 18GX1.5 BLUNT FILL (NEEDLE) ×3 IMPLANT
NEEDLE HYPO 25X1 1.5 SAFETY (NEEDLE) ×3 IMPLANT
PAD ARMBOARD 7.5X6 YLW CONV (MISCELLANEOUS) ×2 IMPLANT
SYR CONTROL 10ML LL (SYRINGE) ×2 IMPLANT
SYR TB 1ML LL NO SAFETY (SYRINGE) ×2 IMPLANT
TAPE SURG TRANSPORE 1 IN (GAUZE/BANDAGES/DRESSINGS) IMPLANT
TAPE SURGICAL TRANSPORE 1 IN (GAUZE/BANDAGES/DRESSINGS) ×2
VISCOELASTIC ADDITIONAL (OPHTHALMIC RELATED) ×2 IMPLANT
WATER STERILE IRR 250ML POUR (IV SOLUTION) ×2 IMPLANT

## 2018-04-14 NOTE — Transfer of Care (Signed)
Immediate Anesthesia Transfer of Care Note  Patient: Jennifer Flores  Procedure(s) Performed: CATARACT EXTRACTION PHACO AND INTRAOCULAR LENS PLACEMENT RIGHT EYE (Right Eye)  Patient Location: PACU  Anesthesia Type:MAC  Level of Consciousness: awake and patient cooperative  Airway & Oxygen Therapy: Patient Spontanous Breathing  Post-op Assessment: Report given to RN and Post -op Vital signs reviewed and stable  Post vital signs: Reviewed and stable  Last Vitals:  Vitals Value Taken Time  BP    Temp    Pulse    Resp    SpO2      Last Pain:  Vitals:   04/14/18 0730  PainSc: 6          Complications: No apparent anesthesia complications

## 2018-04-14 NOTE — Op Note (Addendum)
Date of procedure: 04/14/18  Pre-operative diagnosis: Mature age-related, Visually significant cataract, Right Eye (H25.21)  Post-operative diagnosis: Mature age-related Visually significant cataract, Right Eye  Procedure: Complex Removal of cataract via phacoemulsification and insertion of intra-ocular lens AMO PCB00 +23.5D into the capsular bag of the Right Eye [phone])  Attending surgeon: Gerda Diss. Marisa Hua, MD, MA  Anesthesia:  1.  Retrobulbar injection of 1:1 2% Lidocaine and 0.5% Bupivicaine 2. MAC 2. Topical Akten  Complications: None  Estimated Blood Loss: <23m (minimal)  Specimens: None  Implants: As above  Indications:  Mature, Visually significant cataract, Right Eye  Procedure:  The patient was seen and identified in the pre-operative area. The operative eye was identified and dilated.  The operative eye was marked.  Topical anesthesia was administered to the operative eye.     The patient was then to the operative suite and placed in the supine position.  A timeout was performed confirming the patient, procedure to be performed, and all other relevant information.   A retrobulbar injection of 549mof 1:1 2% Lidocaine and 0.5% Bupivacaine was administered without complication.  The patient's face was prepped and draped in the usual fashion for intra-ocular surgery.  A lid speculum was placed into the operative eye and the surgical microscope moved into place and focused.  A lack of red reflex due to a mature cataract was confirmed.  A superotemporal paracentesis was created using a 20 gauge paracentesis blade.  Vision blue was injected into the anterior chamber.  Shugarcaine was injected into the anterior chamber.  Viscoelastic was injected into the anterior chamber.  A temporal clear-corneal main wound incision was created using a 2.69m33microkeratome.  A continuous curvilinear capsulorrhexis was initiated using an irrigating cystitome and completed using capsulorrhexis forceps.   Hydrodissection and hydrodeliniation were performed.  Viscoelastic was injected into the anterior chamber.  The miLoop was used to bisect the nucleus.  A phacoemulsification handpiece and a chopper as a second instrument were used to remove the nucleus and epinucleus. The irrigation/aspiration handpiece was used to remove any remaining cortical material.   The capsular bag was reinflated with viscoelastic, checked, and found to be intact. The intraocular lens was inserted into the capsular bag and dialed into place using a kuglen hook.  The irrigation/aspiration handpiece was used to remove any remaining viscoelastic.  The clear corneal wound and paracentesis wounds were then hydrated and checked with Weck-Cels to be watertight.  The lid-speculum and drape was removed, and the patient's face was cleaned with a wet and dry 4x4.  Maxitrol was instilled in the eye before a clear shield was taped over the eye. The patient was taken to the post-operative care unit in good condition, having tolerated the procedure well.  Post-Op Instructions: [facility] and will receive all other orders and instructions.

## 2018-04-14 NOTE — H&P (Signed)
The H and P was reviewed and updated. The patient was examined.  No changes were found after exam.  The surgical eye was marked.  

## 2018-04-14 NOTE — Anesthesia Preprocedure Evaluation (Signed)
Anesthesia Evaluation    Airway Mallampati: II       Dental  (+) Edentulous Upper, Edentulous Lower   Pulmonary COPD, Current Smoker,    breath sounds clear to auscultation       Cardiovascular hypertension, On Medications  Rhythm:regular     Neuro/Psych PSYCHIATRIC DISORDERS Anxiety Depression Bipolar Disorder    GI/Hepatic   Endo/Other    Renal/GU      Musculoskeletal   Abdominal   Peds  Hematology   Anesthesia Other Findings H/O ETOH, "narcotic", tobacco abuse, "addiction No interval changes from Phaco/IOL 4 wks ago No issues with MAC with previous Phaco/IOL  Reproductive/Obstetrics                             Anesthesia Physical Anesthesia Plan  ASA: III  Anesthesia Plan: MAC   Post-op Pain Management:    Induction:   PONV Risk Score and Plan:   Airway Management Planned:   Additional Equipment:   Intra-op Plan:   Post-operative Plan:   Informed Consent:   Plan Discussed with: Anesthesiologist  Anesthesia Plan Comments:         Anesthesia Quick Evaluation

## 2018-04-14 NOTE — Anesthesia Postprocedure Evaluation (Signed)
Anesthesia Post Note  Patient: Jennifer Flores  Procedure(s) Performed: CATARACT EXTRACTION PHACO AND INTRAOCULAR LENS PLACEMENT RIGHT EYE (Right Eye)  Patient location during evaluation: Short Stay Anesthesia Type: MAC Level of consciousness: awake and patient cooperative Pain management: pain level controlled Vital Signs Assessment: post-procedure vital signs reviewed and stable Respiratory status: spontaneous breathing, nonlabored ventilation and respiratory function stable Cardiovascular status: blood pressure returned to baseline Postop Assessment: no apparent nausea or vomiting Anesthetic complications: no     Last Vitals:  Vitals:   04/14/18 0730  BP: (!) 166/87  Pulse: 88  Resp: 18  Temp: 36.5 C  SpO2: 99%    Last Pain:  Vitals:   04/14/18 0730  PainSc: 6                  Leron Stoffers J

## 2018-04-17 ENCOUNTER — Encounter (HOSPITAL_COMMUNITY): Payer: Self-pay | Admitting: Ophthalmology

## 2018-05-18 DIAGNOSIS — H524 Presbyopia: Secondary | ICD-10-CM | POA: Diagnosis not present

## 2018-05-18 DIAGNOSIS — Z9841 Cataract extraction status, right eye: Secondary | ICD-10-CM | POA: Diagnosis not present

## 2018-06-20 DIAGNOSIS — F609 Personality disorder, unspecified: Secondary | ICD-10-CM | POA: Diagnosis not present

## 2018-10-11 DIAGNOSIS — F609 Personality disorder, unspecified: Secondary | ICD-10-CM | POA: Diagnosis not present

## 2018-12-15 IMAGING — DX DG CHEST 2V
2 series · 2 of 2 positions shown · non-contrast
Comparison: December 31, 2014

CLINICAL DATA: Cough and shortness of breath

EXAM:
CHEST  2 VIEW

[chest pa]
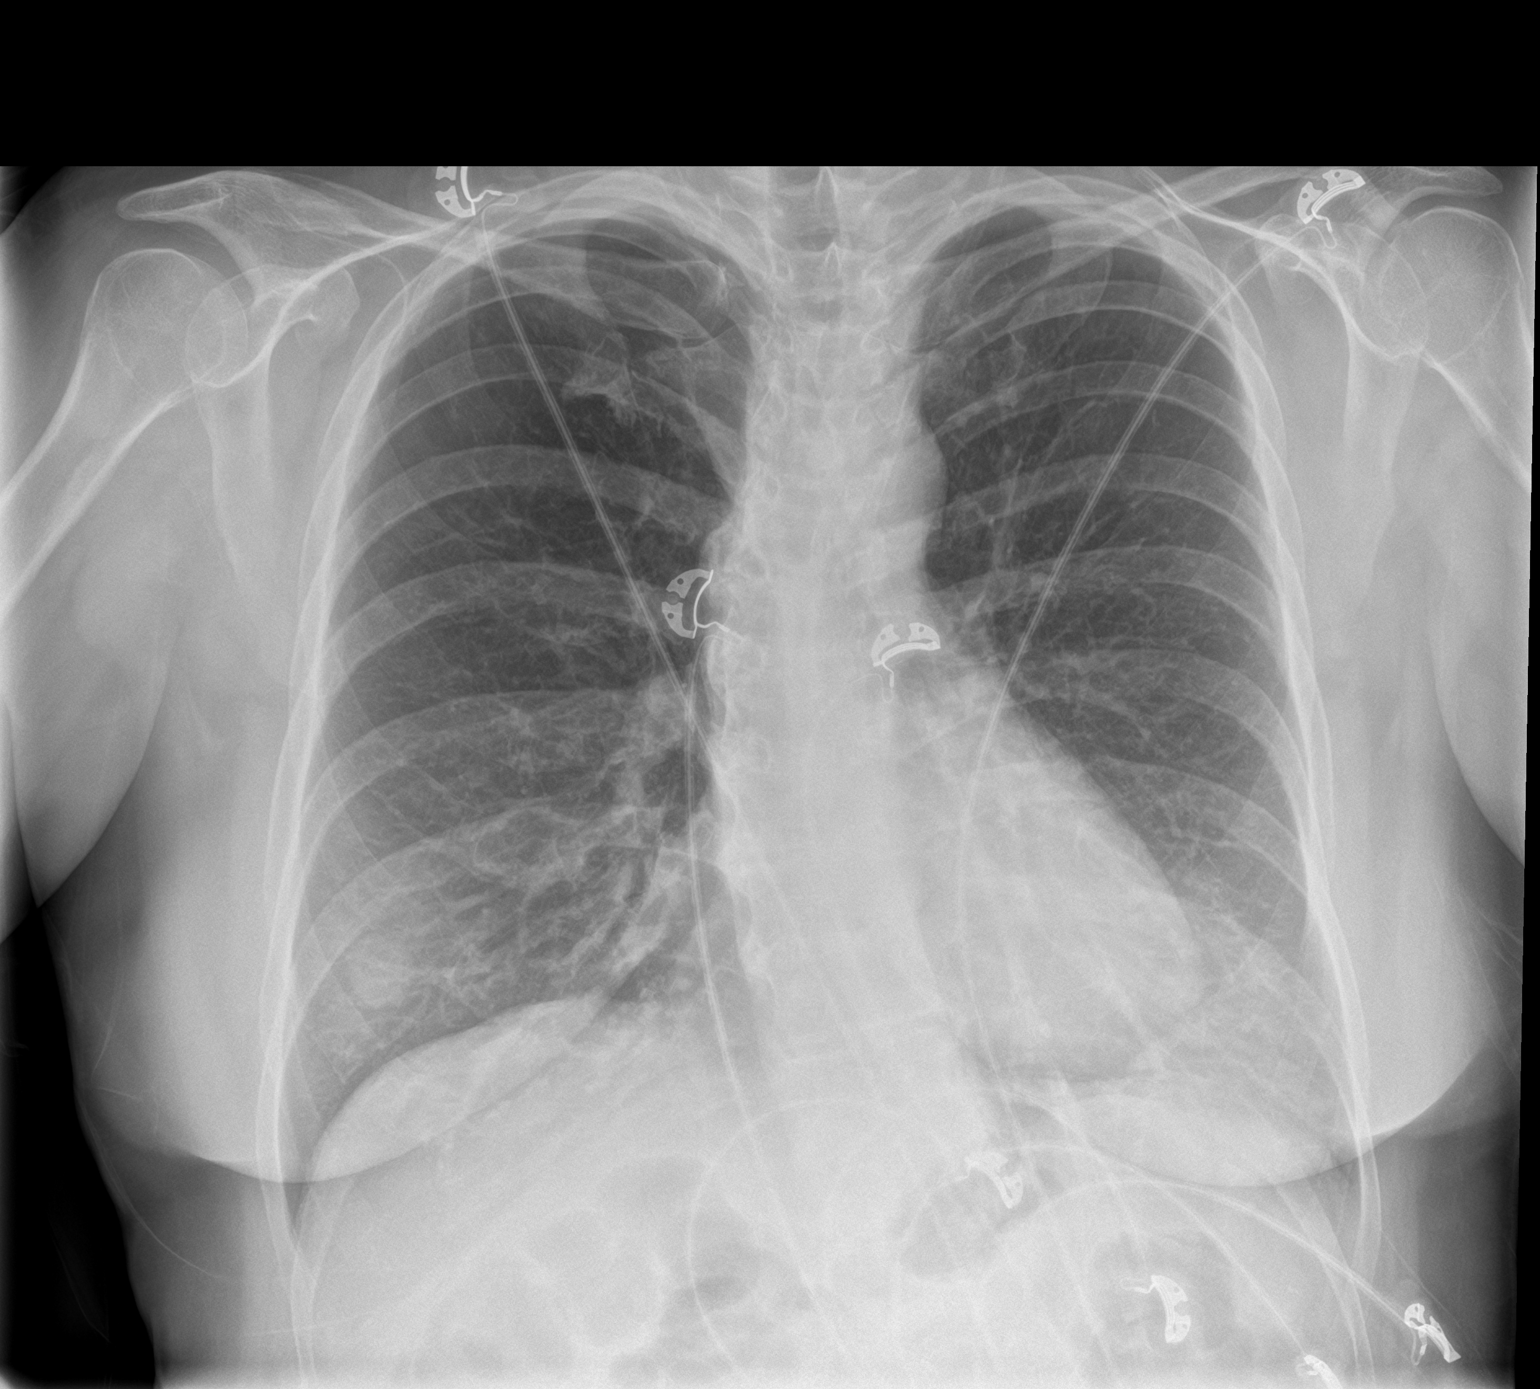

[chest lat]
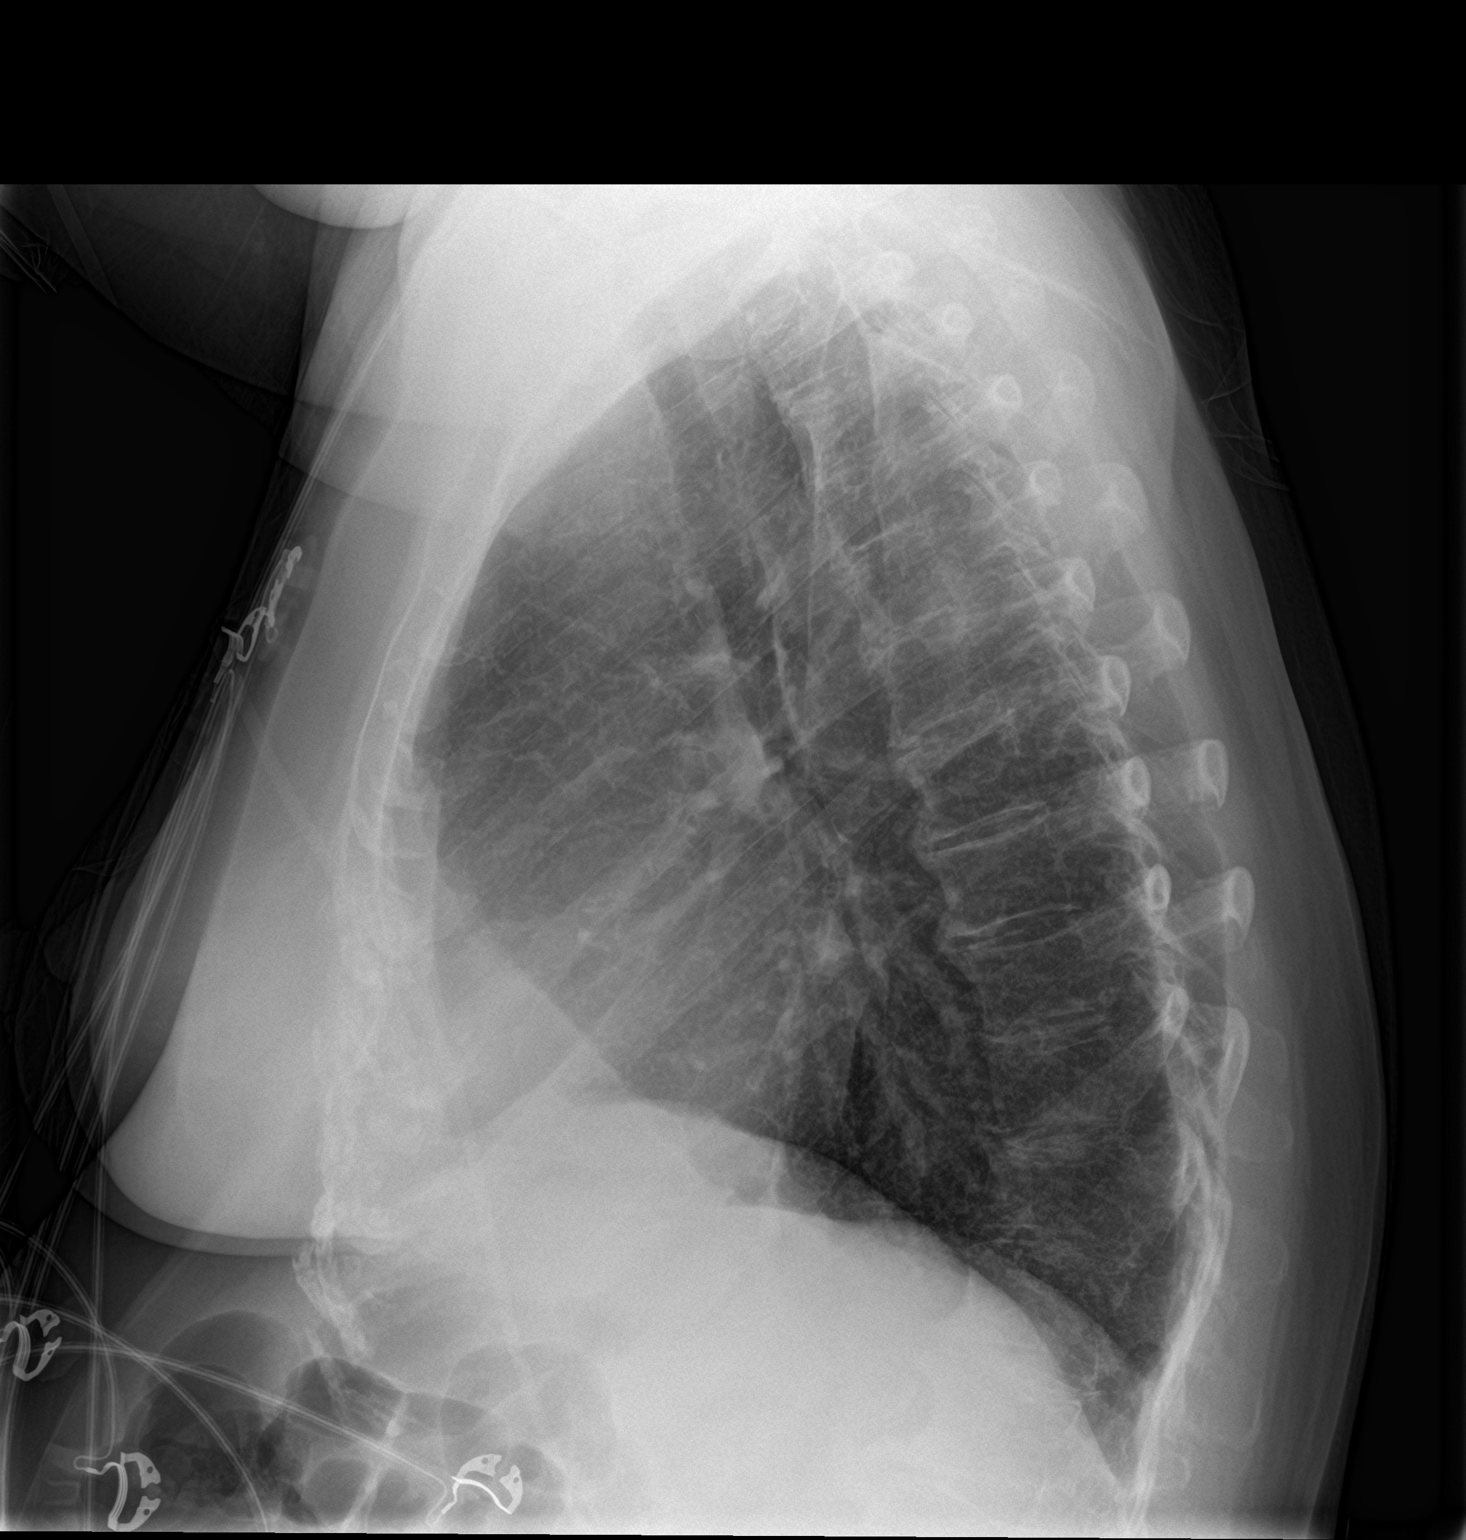

[2 of 2 positions shown; findings below may reference images not displayed]

FINDINGS: There are apparent nipple shadows bilaterally. There is mild
bibasilar atelectasis. There is no edema or consolidation. Heart
size and pulmonary vascularity are normal. No adenopathy. There is
thoracolumbar levoscoliosis. There is degenerative change in the
thoracic spine.
IMPRESSION: Bibasilar atelectasis. No edema or consolidation. Nipple shadows
noted bilaterally. Cardiac silhouette within normal limits.

## 2019-01-22 DIAGNOSIS — J410 Simple chronic bronchitis: Secondary | ICD-10-CM | POA: Diagnosis not present

## 2019-01-22 DIAGNOSIS — L259 Unspecified contact dermatitis, unspecified cause: Secondary | ICD-10-CM | POA: Diagnosis not present

## 2019-01-22 DIAGNOSIS — I10 Essential (primary) hypertension: Secondary | ICD-10-CM | POA: Diagnosis not present

## 2019-01-22 DIAGNOSIS — B888 Other specified infestations: Secondary | ICD-10-CM | POA: Diagnosis not present

## 2019-01-24 DIAGNOSIS — F609 Personality disorder, unspecified: Secondary | ICD-10-CM | POA: Diagnosis not present

## 2019-02-06 DIAGNOSIS — J410 Simple chronic bronchitis: Secondary | ICD-10-CM | POA: Diagnosis not present

## 2019-02-06 DIAGNOSIS — F319 Bipolar disorder, unspecified: Secondary | ICD-10-CM | POA: Diagnosis not present

## 2019-02-06 DIAGNOSIS — Z1331 Encounter for screening for depression: Secondary | ICD-10-CM | POA: Diagnosis not present

## 2019-02-06 DIAGNOSIS — Z23 Encounter for immunization: Secondary | ICD-10-CM | POA: Diagnosis not present

## 2019-02-06 DIAGNOSIS — F17210 Nicotine dependence, cigarettes, uncomplicated: Secondary | ICD-10-CM | POA: Diagnosis not present

## 2019-02-06 DIAGNOSIS — K219 Gastro-esophageal reflux disease without esophagitis: Secondary | ICD-10-CM | POA: Diagnosis not present

## 2019-02-06 DIAGNOSIS — I10 Essential (primary) hypertension: Secondary | ICD-10-CM | POA: Diagnosis not present

## 2019-02-06 DIAGNOSIS — Z1389 Encounter for screening for other disorder: Secondary | ICD-10-CM | POA: Diagnosis not present

## 2019-03-08 DIAGNOSIS — F319 Bipolar disorder, unspecified: Secondary | ICD-10-CM | POA: Diagnosis not present

## 2019-03-08 DIAGNOSIS — K219 Gastro-esophageal reflux disease without esophagitis: Secondary | ICD-10-CM | POA: Diagnosis not present

## 2019-05-31 DIAGNOSIS — R1012 Left upper quadrant pain: Secondary | ICD-10-CM | POA: Diagnosis not present

## 2019-05-31 DIAGNOSIS — Z20822 Contact with and (suspected) exposure to covid-19: Secondary | ICD-10-CM | POA: Diagnosis not present

## 2019-05-31 DIAGNOSIS — R1084 Generalized abdominal pain: Secondary | ICD-10-CM | POA: Diagnosis not present

## 2019-05-31 DIAGNOSIS — Z888 Allergy status to other drugs, medicaments and biological substances status: Secondary | ICD-10-CM | POA: Diagnosis not present

## 2019-05-31 DIAGNOSIS — R05 Cough: Secondary | ICD-10-CM | POA: Diagnosis not present

## 2019-05-31 DIAGNOSIS — R109 Unspecified abdominal pain: Secondary | ICD-10-CM | POA: Diagnosis not present

## 2019-05-31 DIAGNOSIS — K2970 Gastritis, unspecified, without bleeding: Secondary | ICD-10-CM | POA: Diagnosis not present

## 2019-05-31 DIAGNOSIS — K2900 Acute gastritis without bleeding: Secondary | ICD-10-CM | POA: Diagnosis not present

## 2019-05-31 DIAGNOSIS — F319 Bipolar disorder, unspecified: Secondary | ICD-10-CM | POA: Diagnosis not present

## 2019-05-31 DIAGNOSIS — Z79899 Other long term (current) drug therapy: Secondary | ICD-10-CM | POA: Diagnosis not present

## 2019-05-31 DIAGNOSIS — I10 Essential (primary) hypertension: Secondary | ICD-10-CM | POA: Diagnosis not present

## 2019-05-31 DIAGNOSIS — F17200 Nicotine dependence, unspecified, uncomplicated: Secondary | ICD-10-CM | POA: Diagnosis not present

## 2019-05-31 DIAGNOSIS — R1013 Epigastric pain: Secondary | ICD-10-CM | POA: Diagnosis not present

## 2019-06-07 DIAGNOSIS — I10 Essential (primary) hypertension: Secondary | ICD-10-CM | POA: Diagnosis not present

## 2019-06-07 DIAGNOSIS — F319 Bipolar disorder, unspecified: Secondary | ICD-10-CM | POA: Diagnosis not present

## 2019-07-05 DIAGNOSIS — F17210 Nicotine dependence, cigarettes, uncomplicated: Secondary | ICD-10-CM | POA: Diagnosis not present

## 2019-07-05 DIAGNOSIS — F17200 Nicotine dependence, unspecified, uncomplicated: Secondary | ICD-10-CM | POA: Diagnosis not present

## 2019-07-05 DIAGNOSIS — F319 Bipolar disorder, unspecified: Secondary | ICD-10-CM | POA: Diagnosis not present

## 2019-07-05 DIAGNOSIS — I10 Essential (primary) hypertension: Secondary | ICD-10-CM | POA: Diagnosis not present

## 2019-07-05 DIAGNOSIS — J019 Acute sinusitis, unspecified: Secondary | ICD-10-CM | POA: Diagnosis not present

## 2019-07-24 DIAGNOSIS — F609 Personality disorder, unspecified: Secondary | ICD-10-CM | POA: Diagnosis not present

## 2019-09-10 DIAGNOSIS — Z23 Encounter for immunization: Secondary | ICD-10-CM | POA: Diagnosis not present

## 2019-10-09 DIAGNOSIS — Z23 Encounter for immunization: Secondary | ICD-10-CM | POA: Diagnosis not present

## 2019-12-13 DIAGNOSIS — F609 Personality disorder, unspecified: Secondary | ICD-10-CM | POA: Diagnosis not present

## 2020-02-07 DIAGNOSIS — Z1389 Encounter for screening for other disorder: Secondary | ICD-10-CM | POA: Diagnosis not present

## 2020-02-07 DIAGNOSIS — J410 Simple chronic bronchitis: Secondary | ICD-10-CM | POA: Diagnosis not present

## 2020-02-07 DIAGNOSIS — F17210 Nicotine dependence, cigarettes, uncomplicated: Secondary | ICD-10-CM | POA: Diagnosis not present

## 2020-02-07 DIAGNOSIS — Z0001 Encounter for general adult medical examination with abnormal findings: Secondary | ICD-10-CM | POA: Diagnosis not present

## 2020-02-07 DIAGNOSIS — Z1331 Encounter for screening for depression: Secondary | ICD-10-CM | POA: Diagnosis not present

## 2020-02-07 DIAGNOSIS — Z23 Encounter for immunization: Secondary | ICD-10-CM | POA: Diagnosis not present

## 2020-02-07 DIAGNOSIS — I10 Essential (primary) hypertension: Secondary | ICD-10-CM | POA: Diagnosis not present

## 2020-02-07 DIAGNOSIS — F319 Bipolar disorder, unspecified: Secondary | ICD-10-CM | POA: Diagnosis not present

## 2020-02-25 ENCOUNTER — Encounter: Payer: Self-pay | Admitting: Internal Medicine

## 2020-03-08 DIAGNOSIS — I10 Essential (primary) hypertension: Secondary | ICD-10-CM | POA: Diagnosis not present

## 2020-03-08 DIAGNOSIS — F329 Major depressive disorder, single episode, unspecified: Secondary | ICD-10-CM | POA: Diagnosis not present

## 2020-03-11 ENCOUNTER — Encounter: Payer: Medicare Other | Admitting: Obstetrics & Gynecology

## 2020-04-02 DIAGNOSIS — F609 Personality disorder, unspecified: Secondary | ICD-10-CM | POA: Diagnosis not present

## 2020-04-07 NOTE — Progress Notes (Deleted)
Referring Provider: Avon Gully, MD Primary Care Physician:  Avon Gully, MD Primary Gastroenterologist:  Dr. Jena Gauss  No chief complaint on file.   HPI:   Jennifer Flores is a 54 y.o. female presenting today at the request of Avon Gully, MD for consult colonoscopy.  Recommended office visit due to medications and past alcohol use.    Past Medical History:  Diagnosis Date  . Alcohol addiction (HCC)   . Anxiety   . Bipolar disease, chronic (HCC) 2000   Dr. Betti Cruz /   . Bronchitis   . COPD (chronic obstructive pulmonary disease) (HCC)   . Depression   . History of kidney stones   . Hypercholesteremia   . Hypertension   . Narcotic addiction (HCC)   . Palpitations   . Panic attack   . Scabies   . Spider bite 2008   Seen in the ED   . Suicide attempt [facility])     Past Surgical History:  Procedure Laterality Date  . CATARACT EXTRACTION W/PHACO Left 03/17/2018   Procedure: CATARACT EXTRACTION PHACO AND INTRAOCULAR LENS PLACEMENT (IOC);  Surgeon: Fabio Pierce, MD;  Location: [facility];  Service: Ophthalmology;  Laterality: Left;  CDE: 74.93  . CATARACT EXTRACTION W/PHACO Right 04/14/2018   Procedure: CATARACT EXTRACTION PHACO AND INTRAOCULAR LENS PLACEMENT RIGHT EYE;  Surgeon: Fabio Pierce, MD;  Location: [facility];  Service: Ophthalmology;  Laterality: Right;  right  . CERVICAL CERCLAGE    . CESAREAN SECTION  2004  . extraction of teeth  2010    Current Outpatient Medications  Medication Sig Dispense Refill  . albuterol (PROVENTIL HFA;VENTOLIN HFA) 108 (90 BASE) MCG/ACT inhaler Inhale 2 puffs into the lungs every 6 (six) hours as needed for wheezing.    . Difluprednate 0.05 % EMUL Place 1 drop into both eyes 2 (two) times daily.    . risperiDONE (RISPERDAL) 2 MG tablet Take 1 tablet (2 mg total) by mouth at bedtime. For mood stabilization 15 tablet 0  . traZODone (DESYREL) 50 MG tablet Take 0.5 tablets (25 mg total) by mouth at bedtime. 15 tablet 0   No current  facility-administered medications for this visit.    Allergies as of 04/09/2020 - Review Complete 04/14/2018  Allergen Reaction Noted  . Penicillins Hives 05/07/2009    Family History  Problem Relation Age of Onset  . Heart disease Mother   . Hypertension Mother   . Diabetes Father   . Anxiety disorder Father   . Anxiety disorder Sister   . Depression Maternal Grandmother     Social History   Socioeconomic History  . Marital status: Single    Spouse name: Not on file  . Number of children: Not on file  . Years of education: Not on file  . Highest education level: Not on file  Occupational History  . Not on file  Tobacco Use  . Smoking status: Current Every Day Smoker    Packs/day: 0.25    Years: 30.00    Pack years: 7.50    Types: Cigarettes  . Smokeless tobacco: Never Used  . Tobacco comment: down to 5 cigarettes daily  Vaping Use  . Vaping Use: Never used  Substance and Sexual Activity  . Alcohol use: Not Currently    Comment: 2--- 40 oz a day.  last drink last night before 8  . Drug use: No    Comment: patient said she quit approx 2 years ago narcotic addition  . Sexual activity: Yes  Birth control/protection: None  Other Topics Concern  . Not on file  Social History Narrative  . Not on file   Social Determinants of Health   Financial Resource Strain:   . Difficulty of Paying Living Expenses: Not on file  Food Insecurity:   . Worried About Programme researcher, broadcasting/film/video in the Last Year: Not on file  . Ran Out of Food in the Last Year: Not on file  Transportation Needs:   . Lack of Transportation (Medical): Not on file  . Lack of Transportation (Non-Medical): Not on file  Physical Activity:   . Days of Exercise per Week: Not on file  . Minutes of Exercise per Session: Not on file  Stress:   . Feeling of Stress : Not on file  Social Connections:   . Frequency of Communication with Friends and Family: Not on file  . Frequency of Social Gatherings with  Friends and Family: Not on file  . Attends Religious Services: Not on file  . Active Member of Clubs or Organizations: Not on file  . Attends Banker Meetings: Not on file  . Marital Status: Not on file  Intimate Partner Violence:   . Fear of Current or Ex-Partner: Not on file  . Emotionally Abused: Not on file  . Physically Abused: Not on file  . Sexually Abused: Not on file    Review of Systems: Gen: Denies any fever, chills, fatigue, weight loss, lack of appetite.  CV: Denies chest pain, heart palpitations, peripheral edema, syncope.  Resp: Denies shortness of breath at rest or with exertion. Denies wheezing or cough.  GI: Denies dysphagia or odynophagia. Denies jaundice, hematemesis, fecal incontinence. GU : Denies urinary burning, urinary frequency, urinary hesitancy MS: Denies joint pain, muscle weakness, cramps, or limitation of movement.  Derm: Denies rash, itching, dry skin Psych: Denies depression, anxiety, memory loss, and confusion Heme: Denies bruising, bleeding, and enlarged lymph nodes.  Physical Exam: LMP 11/15/2011  General:   Alert and oriented. Pleasant and cooperative. Well-nourished and well-developed.  Head:  Normocephalic and atraumatic. Eyes:  Without icterus, sclera clear and conjunctiva pink.  Ears:  Normal auditory acuity. Nose:  No deformity, discharge,  or lesions. Mouth:  No deformity or lesions, oral mucosa pink.  Neck:  Supple, without mass or thyromegaly. Lungs:  Clear to auscultation bilaterally. No wheezes, rales, or rhonchi. No distress.  Heart:  S1, S2 present without murmurs appreciated.  Abdomen:  +BS, soft, non-tender and non-distended. No HSM noted. No guarding or rebound. No masses appreciated.  Rectal:  Deferred  Msk:  Symmetrical without gross deformities. Normal posture. Pulses:  Normal pulses noted. Extremities:  Without clubbing or edema. Neurologic:  Alert and  oriented x4;  grossly normal neurologically. Skin:   Intact without significant lesions or rashes. Cervical Nodes:  No significant cervical adenopathy. Psych:  Alert and cooperative. Normal mood and affect.

## 2020-04-08 DIAGNOSIS — K219 Gastro-esophageal reflux disease without esophagitis: Secondary | ICD-10-CM | POA: Diagnosis not present

## 2020-04-08 DIAGNOSIS — I10 Essential (primary) hypertension: Secondary | ICD-10-CM | POA: Diagnosis not present

## 2020-04-09 ENCOUNTER — Ambulatory Visit: Payer: Medicare Other | Admitting: Gastroenterology

## 2020-04-14 ENCOUNTER — Encounter: Payer: Medicare Other | Admitting: Obstetrics & Gynecology

## 2020-05-08 DIAGNOSIS — I10 Essential (primary) hypertension: Secondary | ICD-10-CM | POA: Diagnosis not present

## 2020-05-08 DIAGNOSIS — K219 Gastro-esophageal reflux disease without esophagitis: Secondary | ICD-10-CM | POA: Diagnosis not present

## 2020-05-26 NOTE — Progress Notes (Deleted)
Referring Provider:*** Primary Care Physician:  Avon Gully, MD Primary Gastroenterologist:  Dr. Jena Gauss  No chief complaint on file.   HPI:   Jennifer Flores is a 55 y.o. female presenting today at the request of Avon Gully, MD for consult colonoscopy.  Office visit due to medications and past history of alcohol use.    Past Medical History:  Diagnosis Date  . Alcohol addiction (HCC)   . Anxiety   . Bipolar disease, chronic (HCC) 2000   Dr. Betti Cruz /   . Bronchitis   . COPD (chronic obstructive pulmonary disease) (HCC)   . Depression   . History of kidney stones   . Hypercholesteremia   . Hypertension   . Narcotic addiction (HCC)   . Palpitations   . Panic attack   . Scabies   . Spider bite 2008   Seen in the ED   . Suicide attempt [facility])     Past Surgical History:  Procedure Laterality Date  . CATARACT EXTRACTION W/PHACO Left 03/17/2018   Procedure: CATARACT EXTRACTION PHACO AND INTRAOCULAR LENS PLACEMENT (IOC);  Surgeon: Fabio Pierce, MD;  Location: [facility];  Service: Ophthalmology;  Laterality: Left;  CDE: 74.93  . CATARACT EXTRACTION W/PHACO Right 04/14/2018   Procedure: CATARACT EXTRACTION PHACO AND INTRAOCULAR LENS PLACEMENT RIGHT EYE;  Surgeon: Fabio Pierce, MD;  Location: [facility];  Service: Ophthalmology;  Laterality: Right;  right  . CERVICAL CERCLAGE    . CESAREAN SECTION  2004  . extraction of teeth  2010    Current Outpatient Medications  Medication Sig Dispense Refill  . albuterol (PROVENTIL HFA;VENTOLIN HFA) 108 (90 BASE) MCG/ACT inhaler Inhale 2 puffs into the lungs every 6 (six) hours as needed for wheezing.    . Difluprednate 0.05 % EMUL Place 1 drop into both eyes 2 (two) times daily.    . risperiDONE (RISPERDAL) 2 MG tablet Take 1 tablet (2 mg total) by mouth at bedtime. For mood stabilization 15 tablet 0  . traZODone (DESYREL) 50 MG tablet Take 0.5 tablets (25 mg total) by mouth at bedtime. 15 tablet 0   No current  facility-administered medications for this visit.    Allergies as of 05/28/2020 - Review Complete 04/14/2018  Allergen Reaction Noted  . Penicillins Hives 05/07/2009    Family History  Problem Relation Age of Onset  . Heart disease Mother   . Hypertension Mother   . Diabetes Father   . Anxiety disorder Father   . Anxiety disorder Sister   . Depression Maternal Grandmother     Social History   Socioeconomic History  . Marital status: Single    Spouse name: Not on file  . Number of children: Not on file  . Years of education: Not on file  . Highest education level: Not on file  Occupational History  . Not on file  Tobacco Use  . Smoking status: Current Every Day Smoker    Packs/day: 0.25    Years: 30.00    Pack years: 7.50    Types: Cigarettes  . Smokeless tobacco: Never Used  . Tobacco comment: down to 5 cigarettes daily  Vaping Use  . Vaping Use: Never used  Substance and Sexual Activity  . Alcohol use: Not Currently    Comment: 2--- 40 oz a day.  last drink last night before 8  . Drug use: No    Comment: patient said she quit approx 2 years ago narcotic addition  . Sexual activity: Yes    Birth control/protection:  None  Other Topics Concern  . Not on file  Social History Narrative  . Not on file   Social Determinants of Health   Financial Resource Strain: Not on file  Food Insecurity: Not on file  Transportation Needs: Not on file  Physical Activity: Not on file  Stress: Not on file  Social Connections: Not on file  Intimate Partner Violence: Not on file    Review of Systems: Gen: Denies any fever, chills, fatigue, weight loss, lack of appetite.  CV: Denies chest pain, heart palpitations, peripheral edema, syncope.  Resp: Denies shortness of breath at rest or with exertion. Denies wheezing or cough.  GI: Denies dysphagia or odynophagia. Denies jaundice, hematemesis, fecal incontinence. GU : Denies urinary burning, urinary frequency, urinary  hesitancy MS: Denies joint pain, muscle weakness, cramps, or limitation of movement.  Derm: Denies rash, itching, dry skin Psych: Denies depression, anxiety, memory loss, and confusion Heme: Denies bruising, bleeding, and enlarged lymph nodes.  Physical Exam: LMP 11/15/2011  General:   Alert and oriented. Pleasant and cooperative. Well-nourished and well-developed.  Head:  Normocephalic and atraumatic. Eyes:  Without icterus, sclera clear and conjunctiva pink.  Ears:  Normal auditory acuity. Nose:  No deformity, discharge,  or lesions. Mouth:  No deformity or lesions, oral mucosa pink.  Neck:  Supple, without mass or thyromegaly. Lungs:  Clear to auscultation bilaterally. No wheezes, rales, or rhonchi. No distress.  Heart:  S1, S2 present without murmurs appreciated.  Abdomen:  +BS, soft, non-tender and non-distended. No HSM noted. No guarding or rebound. No masses appreciated.  Rectal:  Deferred  Msk:  Symmetrical without gross deformities. Normal posture. Pulses:  Normal pulses noted. Extremities:  Without clubbing or edema. Neurologic:  Alert and  oriented x4;  grossly normal neurologically. Skin:  Intact without significant lesions or rashes. Cervical Nodes:  No significant cervical adenopathy. Psych:  Alert and cooperative. Normal mood and affect.

## 2020-05-28 ENCOUNTER — Ambulatory Visit: Payer: Medicare Other | Admitting: Gastroenterology

## 2020-05-28 ENCOUNTER — Encounter: Payer: Self-pay | Admitting: Internal Medicine

## 2020-06-08 DIAGNOSIS — I10 Essential (primary) hypertension: Secondary | ICD-10-CM | POA: Diagnosis not present

## 2020-06-08 DIAGNOSIS — K219 Gastro-esophageal reflux disease without esophagitis: Secondary | ICD-10-CM | POA: Diagnosis not present

## 2020-07-07 DIAGNOSIS — F329 Major depressive disorder, single episode, unspecified: Secondary | ICD-10-CM | POA: Diagnosis not present

## 2020-07-07 DIAGNOSIS — I10 Essential (primary) hypertension: Secondary | ICD-10-CM | POA: Diagnosis not present

## 2020-07-23 DIAGNOSIS — F609 Personality disorder, unspecified: Secondary | ICD-10-CM | POA: Diagnosis not present

## 2020-08-04 DIAGNOSIS — F329 Major depressive disorder, single episode, unspecified: Secondary | ICD-10-CM | POA: Diagnosis not present

## 2020-08-04 DIAGNOSIS — I10 Essential (primary) hypertension: Secondary | ICD-10-CM | POA: Diagnosis not present

## 2020-09-04 DIAGNOSIS — I10 Essential (primary) hypertension: Secondary | ICD-10-CM | POA: Diagnosis not present

## 2020-09-04 DIAGNOSIS — F17200 Nicotine dependence, unspecified, uncomplicated: Secondary | ICD-10-CM | POA: Diagnosis not present

## 2020-11-20 DIAGNOSIS — F609 Personality disorder, unspecified: Secondary | ICD-10-CM | POA: Diagnosis not present

## 2020-12-04 DIAGNOSIS — F319 Bipolar disorder, unspecified: Secondary | ICD-10-CM | POA: Diagnosis not present

## 2020-12-04 DIAGNOSIS — J019 Acute sinusitis, unspecified: Secondary | ICD-10-CM | POA: Diagnosis not present

## 2020-12-04 DIAGNOSIS — F17210 Nicotine dependence, cigarettes, uncomplicated: Secondary | ICD-10-CM | POA: Diagnosis not present

## 2020-12-04 DIAGNOSIS — H6691 Otitis media, unspecified, right ear: Secondary | ICD-10-CM | POA: Diagnosis not present

## 2020-12-04 DIAGNOSIS — I10 Essential (primary) hypertension: Secondary | ICD-10-CM | POA: Diagnosis not present

## 2021-01-14 DIAGNOSIS — K219 Gastro-esophageal reflux disease without esophagitis: Secondary | ICD-10-CM | POA: Diagnosis not present

## 2021-01-14 DIAGNOSIS — F339 Major depressive disorder, recurrent, unspecified: Secondary | ICD-10-CM | POA: Diagnosis not present

## 2021-01-14 DIAGNOSIS — Z1389 Encounter for screening for other disorder: Secondary | ICD-10-CM | POA: Diagnosis not present

## 2021-01-14 DIAGNOSIS — F319 Bipolar disorder, unspecified: Secondary | ICD-10-CM | POA: Diagnosis not present

## 2021-01-14 DIAGNOSIS — I10 Essential (primary) hypertension: Secondary | ICD-10-CM | POA: Diagnosis not present

## 2021-01-14 DIAGNOSIS — Z1331 Encounter for screening for depression: Secondary | ICD-10-CM | POA: Diagnosis not present

## 2021-02-03 DIAGNOSIS — E7849 Other hyperlipidemia: Secondary | ICD-10-CM | POA: Diagnosis not present

## 2021-02-03 DIAGNOSIS — I10 Essential (primary) hypertension: Secondary | ICD-10-CM | POA: Diagnosis not present

## 2021-02-03 DIAGNOSIS — K219 Gastro-esophageal reflux disease without esophagitis: Secondary | ICD-10-CM | POA: Diagnosis not present

## 2021-02-03 DIAGNOSIS — E785 Hyperlipidemia, unspecified: Secondary | ICD-10-CM | POA: Diagnosis not present

## 2021-07-09 DIAGNOSIS — Z20822 Contact with and (suspected) exposure to covid-19: Secondary | ICD-10-CM | POA: Diagnosis not present

## 2021-07-24 DIAGNOSIS — Z20828 Contact with and (suspected) exposure to other viral communicable diseases: Secondary | ICD-10-CM | POA: Diagnosis not present

## 2021-07-29 DIAGNOSIS — Z20822 Contact with and (suspected) exposure to covid-19: Secondary | ICD-10-CM | POA: Diagnosis not present

## 2021-08-08 DIAGNOSIS — Z20828 Contact with and (suspected) exposure to other viral communicable diseases: Secondary | ICD-10-CM | POA: Diagnosis not present

## 2021-08-24 DIAGNOSIS — Z20822 Contact with and (suspected) exposure to covid-19: Secondary | ICD-10-CM | POA: Diagnosis not present

## 2021-09-10 DIAGNOSIS — Z20822 Contact with and (suspected) exposure to covid-19: Secondary | ICD-10-CM | POA: Diagnosis not present

## 2021-09-14 DIAGNOSIS — Z20822 Contact with and (suspected) exposure to covid-19: Secondary | ICD-10-CM | POA: Diagnosis not present

## 2022-04-10 DIAGNOSIS — R42 Dizziness and giddiness: Secondary | ICD-10-CM | POA: Diagnosis not present

## 2022-04-10 DIAGNOSIS — R079 Chest pain, unspecified: Secondary | ICD-10-CM | POA: Diagnosis not present

## 2022-06-25 DIAGNOSIS — F411 Generalized anxiety disorder: Secondary | ICD-10-CM | POA: Diagnosis not present

## 2022-07-26 DIAGNOSIS — F411 Generalized anxiety disorder: Secondary | ICD-10-CM | POA: Diagnosis not present

## 2023-06-14 DIAGNOSIS — F411 Generalized anxiety disorder: Secondary | ICD-10-CM | POA: Diagnosis not present

## 2023-12-12 DIAGNOSIS — F411 Generalized anxiety disorder: Secondary | ICD-10-CM | POA: Diagnosis not present
